# Patient Record
Sex: Female | Born: 1980 | Race: Black or African American | Hispanic: No | Marital: Married | Smoking: Never smoker
Health system: Southern US, Community
[De-identification: ages and names within clinical notes are randomized; demographics above are authoritative.]

## PROBLEM LIST (undated history)

## (undated) DIAGNOSIS — D649 Anemia, unspecified: Secondary | ICD-10-CM

## (undated) DIAGNOSIS — D219 Benign neoplasm of connective and other soft tissue, unspecified: Secondary | ICD-10-CM

## (undated) DIAGNOSIS — E282 Polycystic ovarian syndrome: Secondary | ICD-10-CM

## (undated) HISTORY — DX: Anemia, unspecified: D64.9

## (undated) HISTORY — DX: Benign neoplasm of connective and other soft tissue, unspecified: D21.9

## (undated) HISTORY — DX: Polycystic ovarian syndrome: E28.2

---
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
## 2004-02-22 ENCOUNTER — Emergency Department (HOSPITAL_COMMUNITY): Admission: EM | Admit: 2004-02-22 | Discharge: 2004-02-22 | Payer: Self-pay | Admitting: Emergency Medicine

## 2004-06-12 ENCOUNTER — Emergency Department (HOSPITAL_COMMUNITY): Admission: EM | Admit: 2004-06-12 | Discharge: 2004-06-12 | Payer: Self-pay | Admitting: Emergency Medicine

## 2004-06-18 ENCOUNTER — Ambulatory Visit (HOSPITAL_COMMUNITY): Admission: RE | Admit: 2004-06-18 | Discharge: 2004-06-18 | Payer: Self-pay | Admitting: *Deleted

## 2004-11-04 ENCOUNTER — Ambulatory Visit (HOSPITAL_COMMUNITY): Admission: RE | Admit: 2004-11-04 | Discharge: 2004-11-04 | Payer: Self-pay | Admitting: *Deleted

## 2004-11-08 ENCOUNTER — Inpatient Hospital Stay (HOSPITAL_COMMUNITY): Admission: RE | Admit: 2004-11-08 | Discharge: 2004-11-11 | Payer: Self-pay | Admitting: *Deleted

## 2010-07-11 NOTE — Op Note (Signed)
NAME:  Gina Marquez, Gina Marquez              ACCOUNT NO.:  [id number]   MEDICAL RECORD NO.:  [id number]          PATIENT TYPE:  INP   LOCATION:  A401                          FACILITY:  [facility]   PHYSICIAN:  Donald Carlson, MD     DATE OF BIRTH:  05/24/1980   DATE OF PROCEDURE:  11/08/2004  DATE OF DISCHARGE:                                 OPERATIVE REPORT   DIAGNOSES:  1.  39 week intrauterine pregnancy.  2.  Oligohydramnios.   PROCEDURE:  1.  Placement of continuous lumbar epidural analgesia.  2.  Outlet vacuum extraction 6 pounds 10 ounces female infant.  3.  Midline episiotomy repair, delivery performed by Dr. Donald Carlson.   COMPLICATIONS:  None.   SPECIMENS:  Arterial cord gas and cord blood to laboratory. Placenta is  examined noted be apparently intact with a three-vessel umbilical cord.  Total estimated blood loss less than 500 cc.   SUMMARY:  The patient noted to be at 38-[redacted] weeks gestation. She presented to  [facility] in early stages of active labor she was admitted and  amniotomy performed at 3 cm dilatation. The patient initially request only  IV narcotics however, the second dose of IV sedation was not nearly as  efficacious as the first thus.  The patient requested epidural.  Epidural  was placed without difficulty, functioned very well. She was noted be 6 cm  dilated immediately after placement of the epidural.  Subsequently she  progressed well along the labor curve to 9+ centimeters dilatation. She did  have some variable type decelerations at that point time. These resolved  without any intervention.  Subsequently she did reach complete dilatation,  pushed for about 35 minutes. She was able to have descent of the vertex to  the perineal floor but no further.  In dorsolithotomy position, she is  prepped and draped usual sterile manner. Total of 30 cc 1% lidocaine plain  injected the midline and perineal body with distension of the perineum.  Small midline  episiotomy performed and outlet vacuum extraction is performed  utilizing Kiwi vacuum extractor over the midline episiotomy without  extension. Mouth and nares bulb suctioned of clear amniotic fluid.  Spontaneous rotation occurred to a right anterior shoulder position.  Expulsive efforts plus gentle downward traction resulted in delivery of this  anterior shoulder as well as remainder of the infant without difficulty.  Umbilical cord is then milked toward the infant.  Cord is doubly clamped and  cut.  The infant was given to nursery staff for assessment. Arterial cord  gas and cord blood was obtained. Gentle traction umbilical cord results in  separation which upon examination appears to be intact placenta with three-  vessel umbilical cord. Examination genital tract reveals no lacerations.  Midline episiotomy is not extended. This was repaired utilizing 0 chromic in  a running locked fashion on the vaginal mucosa followed by a deep layer of 2-  0 Vicryl in the superficial transverse perineal  muscle followed by zero chromic running suture on the perineal body. The  patient tolerated delivery very well. She is taken out   dorsolithotomy  position, rolled to her side at which time the epidural catheter was  removed. The blue tip noted be intact.      Donald Carlson, MD  Electronically Signed     DC/MEDQ  D:  11/08/2004  T:  11/10/2004  Job:  [id number] 

## 2010-07-11 NOTE — Op Note (Signed)
[name], Gina Marquez              ACCOUNT NO.:  [id number]   MEDICAL RECORD NO.:  [id number]          PATIENT TYPE:  INP   LOCATION:  A401                          FACILITY:  [facility]   PHYSICIAN:  Langley Gauss, MD     DATE OF BIRTH:  05/20/1980   DATE OF PROCEDURE:  11/08/2004  DATE OF DISCHARGE:                                 OPERATIVE REPORT   DIAGNOSES:  1.  39 week intrauterine pregnancy.  2.  Oligohydramnios.   PROCEDURE:  1.  Placement of continuous lumbar epidural analgesia.  2.  Outlet vacuum extraction 6 pounds 10 ounces female infant.  3.  Midline episiotomy repair, delivery performed by Dr. Langley Gauss.   COMPLICATIONS:  None.   SPECIMENS:  Arterial cord gas and cord blood to laboratory. Placenta is  examined noted be apparently intact with a three-vessel umbilical cord.  Total estimated blood loss less than 500 cc.   SUMMARY:  The patient noted to be at 38-[redacted] weeks gestation. She presented to  [facility] in early stages of active labor she was admitted and  amniotomy performed at 3 cm dilatation. The patient initially request only  IV narcotics however, the second dose of IV sedation was not nearly as  efficacious as the first thus.  The patient requested epidural.  Epidural  was placed without difficulty, functioned very well. She was noted be 6 cm  dilated immediately after placement of the epidural.  Subsequently she  progressed well along the labor curve to 9+ centimeters dilatation. She did  have some variable type decelerations at that point time. These resolved  without any intervention.  Subsequently she did reach complete dilatation,  pushed for about 35 minutes. She was able to have descent of the vertex to  the perineal floor but no further.  In dorsolithotomy position, she is  prepped and draped usual sterile manner. Total of 30 cc 1% lidocaine plain  injected the midline and perineal body with distension of the perineum.  Small midline  episiotomy performed and outlet vacuum extraction is performed  utilizing Kiwi vacuum extractor over the midline episiotomy without  extension. Mouth and nares bulb suctioned of clear amniotic fluid.  Spontaneous rotation occurred to a right anterior shoulder position.  Expulsive efforts plus gentle downward traction resulted in delivery of this  anterior shoulder as well as remainder of the infant without difficulty.  Umbilical cord is then milked toward the infant.  Cord is doubly clamped and  cut.  The infant was given to nursery staff for assessment. Arterial cord  gas and cord blood was obtained. Gentle traction umbilical cord results in  separation which upon examination appears to be intact placenta with three-  vessel umbilical cord. Examination genital tract reveals no lacerations.  Midline episiotomy is not extended. This was repaired utilizing 0 chromic in  a running locked fashion on the vaginal mucosa followed by a deep layer of 2-  0 Vicryl in the superficial transverse perineal  muscle followed by zero chromic running suture on the perineal body. The  patient tolerated delivery very well. She is taken out  dorsolithotomy  position, rolled to her side at which time the epidural catheter was  removed. The blue tip noted be intact.      Langley Gauss, MD  Electronically Signed     DC/MEDQ  D:  11/08/2004  T:  11/10/2004  Job:  [id number]

## 2010-07-11 NOTE — H&P (Signed)
Gina Marquez, Gina Marquez              ACCOUNT NO.:  [id number]   MEDICAL RECORD NO.:  [id number]          PATIENT TYPE:  INP   LOCATION:  LDR1                          FACILITY:  [facility]   PHYSICIAN:  Langley Gauss, MD     DATE OF BIRTH:  1980/12/10   DATE OF ADMISSION:  11/08/2004  DATE OF DISCHARGE:  LH                                HISTORY & PHYSICAL   HISTORY OF PRESENT ILLNESS:  The patient presents and is admitted in early  stages of labor.  The patient is a 30 year old gravida 2, para 0, at 38-5/[redacted]  weeks gestation who complains of onset of uterine contractions at 0500,  increase in frequency and intensity, such that she presented to [facility] for evaluation at which time she was noted to be contracting about  every five minutes with cervical examination 1 to 2 cm dilated, thick  cervix, and posterior position.  The patient admitted in labor at that time.  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  Additionally, a recent ultrasound performed on November 04, 2004, revealed  amniotic fluid index of 2, with adequate growth.  Interpretation of the  radiology was significant for oligohydramnios with a decreased AFI of 2 cm  of uncertain etiology.  This report was obtained in the office on November 06, 2004, at which time the patient was contacted, and advised to present  for evaluation.  The patient does state today that she was contacted on  November 06, 2004, and advised to come in for evaluation, but she did not  comply with this.  The remainder of prenatal course also complicated by  positive sickle cell trait.  The father of the baby's status was unknown,  but the patient provides no information regarding this.  We did have him  tested during the pregnancy and he was noted to be GBS carrier status  negative.   ALLERGIES:  HYDROCODONE.  States that all  codeine's give her hives.   PAST MEDICAL HISTORY:  No other medical or surgical history.   SOCIAL HISTORY:  The patient was employed at [employer] _____________.  The  father of the baby is Tinlee Navarrette, and was employed at [employer].  They are  currently separated, or at least at the time of questioning.   PHYSICAL EXAMINATION:  VITAL SIGNS:  Height is 5 feet 8 inches.  Pre-  pregnancy is 190, most recent weight is 216.  Blood pressure 130/82, pulse  rate 80, respiratory rate is 20.  HEENT:  Negative.  No adenopathy.  NECK:  Supple, thyroid is nonpalpable.  LUNGS:  Clear.  CARDIOVASCULAR:  Regular rate and rhythm.  ABDOMEN:  Soft and nontender.  Gravid uterus is identified.  Fundal height  on November 04, 2004, is 36 cm, vertex presentation identified.  EXTREMITIES:  Only trace edema.  PELVIC:  Normal external genitalia, no lesions or ulcerations identified.  No vaginal bleeding.  No leaks or fluid.  External fetal monitoring reveals  a reassuring fetal heart  rate with accelerations noted, contractions every  five minutes.  Pelvic examination:  Cervix is 3 cm dilated, mid position,  80% effaced, vertex at a 0 station and well applied to the cervix.  Fetal  scalp electrode is placed with resultant amniotomy, no fluid has been seen  at this time.   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  The patient  will require treatment during the course of labor in an effort to prevent  vertical transmission to the infant.  She upon examination does seem to have  a clinical adequate pelvis by clinical _______________.      Langley Gauss, MD  Electronically Signed     DC/MEDQ  D:  11/08/2004  T:  11/08/2004  Job:  [id number]

## 2010-07-11 NOTE — Op Note (Signed)
Gina Marquez, Gina Marquez              ACCOUNT NO.:  [id number]   MEDICAL RECORD NO.:  [id number]           PATIENT TYPE:   LOCATION:                                FACILITY:  [facility]   PHYSICIAN:  Langley Gauss, MD          DATE OF BIRTH:   DATE OF PROCEDURE:  11/08/2004  DATE OF DISCHARGE:                                 OPERATIVE REPORT   PROCEDURE:  Placement of continuous lumbar epidural analgesia.  Appropriate  informed consent is obtained.  The patient is in the seated position.  Bony  landmarks identified and L3-L4 interspace is chosen.  The patient's back is  sterilely prepped and draped utilizing epidural tray, 5 cc 1% lidocaine  plain injected to raise a small skin wheal.  A 17 gauge Tuohy Schliff needle  was then utilized for a loss of resistance with air-filled glass syringe.  Prior to identify atraumatic entry in the epidural space on the first  attempt excellent loss resistance was noted.  Initial test dose 5 cc 1.5%  lidocaine plus epinephrine injected without complication through the  epidural needle.  The epidural catheter was then inserted to a depth of 5  cm.  The epidural needle was removed, however, aspiration tests reveals  frank blood.  The epidural catheter was then sequentially pulled back to 4,  3, 2 and 1 cm depth within the epidural space.  On each of these frank blood  was obtained, thus the epidural catheter was removed.  Reinsertion was  performed.  On the second attempt, there was again  some loss resistance  consistent with entering the epidural space.  The catheter was now inserted  a depth of 5 cm.  Epidural needle was removed. Aspiration test was negative.  A second test dose 2 cc 1.5% lidocaine plus epinephrine was injected through  the epidural needle. Again no signs of CSF or intravascular injection  obtained thus the epidural catheter was secured in place.  The patient the  patient is connected the infusion pump containing the standard continuous  mixture and is treated with a continuous infusion rate of 14 cc/hour upon  return to lateral supine position.  The patient does have evidence of  bilateral block in place with warmth and tingling in her feet.  Examination  reveals cervix 6 cm completely effaced, vertex at a 0 station, well applied  to the cervix. Fetal scalp electrode reveals a reassuring fetal heart rate  with the uterine contractions occurring every 3-4 minutes.   ASSESSMENT AND PLAN:  The patient was adequate bilateral block in place  connected to the infusion pump.  She is receiving Pitocin augmentation for  dysfunctional labor pattern with inadequate frequency and intensive uterine  contractions.      Langley Gauss, MD  Electronically Signed     DC/MEDQ  D:  11/08/2004  T:  11/08/2004  Job:  [id number]

## 2010-07-11 NOTE — Discharge Summary (Signed)
Gina Marquez, Gina Marquez              ACCOUNT NO.:  [id number]   MEDICAL RECORD NO.:  [id number]          PATIENT TYPE:  INP   LOCATION:  A401                          FACILITY:  [facility]   PHYSICIAN:  Langley Gauss, MD     DATE OF BIRTH:  12/09/1980   DATE OF ADMISSION:  11/08/2004  DATE OF DISCHARGE:  09/19/2006LH                                 DISCHARGE SUMMARY   Placement of Epidural:  November 08, 2004.  Vaginal Delivery:  November 08, 2004.   LABORATORY DATA:  Sickle trait positive.  Father of the baby is negative.  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  Admission labs:  Hemoglobin and hematocrit  12.1/35.7 with white count 21.0, postpartum day #1 11.7/32.8 with white  count of 16.1.  Postpartum day #2, hemoglobin 13.1, hematocrit 38.3.   DISCHARGE MEDICATIONS:  Tylox and Motrin for pain relief.  The patient is  bottle feeding.  She will start Ortho Tri-Cyclen for birth control purposes  in about 4 weeks postpartum.  Circumcision was performed on November 11, 2004, on day of discharge.   HOSPITAL COURSE:  See previous dictation.  Postpartum, the patient did well.  She had no postpartum complications, bonded well with the infant.  Infant  did require hospitalization x48 hours postpartum due to the GBS positive  carrier status.  Mother and infant subsequently discharged home on November 11, 2004.      Langley Gauss, MD  Electronically Signed     DC/MEDQ  D:  11/26/2004  T:  11/26/2004  Job:  [id number]

## 2012-02-02 ENCOUNTER — Emergency Department (HOSPITAL_COMMUNITY): Payer: BC Managed Care – PPO

## 2012-02-02 ENCOUNTER — Emergency Department (HOSPITAL_COMMUNITY)
Admission: EM | Admit: 2012-02-02 | Discharge: 2012-02-02 | Disposition: A | Discharge status: Home / Self Care | Payer: BC Managed Care – PPO | Attending: Emergency Medicine | Admitting: Emergency Medicine

## 2012-02-02 ENCOUNTER — Encounter (HOSPITAL_COMMUNITY): Payer: Self-pay

## 2012-02-02 DIAGNOSIS — M5380 Other specified dorsopathies, site unspecified: Secondary | ICD-10-CM | POA: Insufficient documentation

## 2012-02-02 DIAGNOSIS — M542 Cervicalgia: Secondary | ICD-10-CM | POA: Insufficient documentation

## 2012-02-02 DIAGNOSIS — M62838 Other muscle spasm: Secondary | ICD-10-CM

## 2012-02-02 MED ORDER — IBUPROFEN 800 MG PO TABS
800.0000 mg | ORAL_TABLET | Freq: Once | ORAL | Status: AC
  Administered 2012-02-02: 800 mg via ORAL
  Filled 2012-02-02: qty 1

## 2012-02-02 MED ORDER — METHOCARBAMOL 500 MG PO TABS: 1000.0000 mg | ORAL_TABLET | Freq: Two times a day (BID) | ORAL | Status: DC

## 2012-02-02 MED ORDER — DIAZEPAM 5 MG PO TABS
10.0000 mg | ORAL_TABLET | Freq: Once | ORAL | Status: AC
  Administered 2012-02-02: 10 mg via ORAL
  Filled 2012-02-02: qty 2

## 2012-02-02 NOTE — ED Provider Notes (Signed)
History    This chart was scribed for Jones Skene, MD, MD by Smitty Pluck, ED Scribe. The patient was seen in room APA07 and the patient's care was started at 7:52AM.   CSN: [id number]  Arrival date & time 02/02/12  4098      Chief Complaint  Patient presents with  . Back Pain    The history is provided by the patient. No language interpreter was used.   Gina Marquez is a 31 y.o. female who presents to the Emergency Department complaining of waxing and waning, moderate sharp neck pain onset 8 days ago. Pain starts at C7 and shoots up the spine. Pain is worse on right side compared to left. Pain is rated at 7/10 currently but at worse is 9/10. Per pt pain is worse at night when she is sleeping in bed. She reports that she has a new mattress. She has used new pillows that have mildly improved symptoms. Pt went to PCP last week and was told the pain was caused by muscle spasm. She has taken prescribed medication without relief. She denies injury, numbness or tingling in extremities, weakness in extremities, dropping objects, chest pain, SOB, rash, nausea, vomiting, abdominal pain and any other pain.   History reviewed. No pertinent past medical history.  History reviewed. No pertinent past surgical history.  No family history on file.  History  Substance Use Topics  . Smoking status: Never Smoker   . Smokeless tobacco: Not on file  . Alcohol Use: No    OB History    Grav Para Term Preterm Abortions TAB SAB Ect Mult Living                  Review of Systems At least 10pt or greater review of systems completed and are negative except where specified in the HPI.  Allergies  Review of patient's allergies indicates no known allergies.  Home Medications  No current outpatient prescriptions on file.  BP 109/77  Pulse 75  Temp 98.3 F (36.8 C) (Oral)  Resp 20  Ht 5\' 6"  (1.676 m)  Wt 200 lb (90.719 kg)  BMI 32.28 kg/m2  SpO2 100%  LMP 12/30/2011  Physical Exam   Nursing note and vitals reviewed.  Nursing notes reviewed.  Electronic medical record reviewed. VITAL SIGNS:   Filed Vitals:   02/02/12 0735  BP: 109/77  Pulse: 75  Temp: 98.3 F (36.8 C)  TempSrc: Oral  Resp: 20  Height: 5\' 6"  (1.676 m)  Weight: 200 lb (90.719 kg)  SpO2: 100%   CONSTITUTIONAL: Awake, oriented, appears non-toxic HENT: Atraumatic, normocephalic, oral mucosa pink and moist, airway patent. Nares patent without drainage. External ears normal. EYES: Conjunctiva clear, EOMI, PERRLA NECK: Trachea midline, non-tender in the midline. Patient has paraspinous muscle tenderness straight about C4 that radiates upwards towards the base of the skull, supple CARDIOVASCULAR: Normal heart rate, Normal rhythm, No murmurs, rubs, gallops PULMONARY/CHEST: Clear to auscultation, no rhonchi, wheezes, or rales. Symmetrical breath sounds. Non-tender. ABDOMINAL: Non-distended, soft, non-tender - no rebound or guarding.  BS normal. NEUROLOGIC: Non-focal, moving all four extremities, no gross sensory or motor deficits. Facial sensation equal to light touch bilaterally.  Good muscle bulk in the masseter muscle and good lateral movement of the jaw.  Facial expressions equal and good strength with smile/frown and puffed cheeks.  Hearing grossly intact to finger rub test.  Uvula, tongue are midline with no deviation. Symmetrical palate elevation.  Trapezius and SCM muscles are 5/5 strength bilaterally.  EXTREMITIES: No clubbing, cyanosis, or edema SKIN: Warm, Dry, No erythema, No rash  ED Course  Procedures (including critical care time) DIAGNOSTIC STUDIES: Oxygen Saturation is 100% on room air, normal by my interpretation.    COORDINATION OF CARE: 8:00 AM Discussed ED treatment with pt  8:42 AM Recheck: Discussed pt imaging results with pt. Pt will be given Valium for pain before discharge. Pt is ready for discharge.    Labs Reviewed - No data to display Dg Cervical Spine 2-3  Views  02/02/2012  *RADIOLOGY REPORT*  Clinical Data: back pain  CERVICAL SPINE - 2-3 VIEW  Comparison: None  Findings: There is reversal of normal cervical lordosis.  The vertebral body heights are well preserved.  The spaces are within normal limits.  No prevertebral soft tissue swelling.  IMPRESSION:  1.  No fractures or subluxations. 2.  Reversal of normal cervical lordosis which may reflect patient positioning or muscle spasm.   Original Report Authenticated By: Signa Kell, M.D.      1. Neck pain   2. Cervical paraspinal muscle spasm       MDM  Gina Marquez is a 31 y.o. female presents with neck pain since last week. Primary care physician give her some pain medicine and muscle relaxants which are not helping. On further history, patient says that she has new bedding and new pillows. I told her to go back to her prior sleeping arrangement and I prescribed Robaxin for discomfort. Patient is urged to followup with her primary care physician.  No neurologic deficits, no neurosurgical emergency at this time.  I explained the diagnosis and have given explicit precautions to return to the ER including numbness tingling or weakness in her upper extremities, worsening neck pain or any other new or worsening symptoms. The patient understands and accepts the medical plan as it's been dictated and I have answered their questions. Discharge instructions concerning home care and prescriptions have been given.  The patient is STABLE and is discharged to home in good condition.   I personally performed the services described in this documentation, which was scribed in my presence. The recorded information has been reviewed and is accurate. Jones Skene, M.D.     Jones Skene, MD 02/02/12 1547

## 2012-02-02 NOTE — ED Notes (Signed)
Pt reports back pain and spasm since last week, was seen by pmd and given meds, they are not helping. Denies any known injury or urinary s/s

## 2012-06-14 ENCOUNTER — Other Ambulatory Visit: Payer: Self-pay | Admitting: Family Medicine

## 2012-06-14 NOTE — Telephone Encounter (Signed)
Ok times 3 total 

## 2012-06-14 NOTE — Telephone Encounter (Signed)
Med called into [facility].

## 2012-10-29 ENCOUNTER — Encounter: Payer: Self-pay | Admitting: *Deleted

## 2012-10-31 ENCOUNTER — Encounter: Payer: Self-pay | Admitting: Family Medicine

## 2012-10-31 ENCOUNTER — Ambulatory Visit (INDEPENDENT_AMBULATORY_CARE_PROVIDER_SITE_OTHER): Payer: BC Managed Care – PPO | Admitting: Family Medicine

## 2012-10-31 VITALS — BP 132/90 | Ht 65.0 in | Wt 213.8 lb

## 2012-10-31 DIAGNOSIS — Z0000 Encounter for general adult medical examination without abnormal findings: Secondary | ICD-10-CM

## 2012-10-31 DIAGNOSIS — R635 Abnormal weight gain: Secondary | ICD-10-CM

## 2012-10-31 DIAGNOSIS — Z79899 Other long term (current) drug therapy: Secondary | ICD-10-CM

## 2012-10-31 DIAGNOSIS — R358 Other polyuria: Secondary | ICD-10-CM

## 2012-10-31 NOTE — Progress Notes (Signed)
  Subjective:    Patient ID: Gina Marquez, female    DOB: September 17, 1980, 31 y.o.   MRN: [id number]  HPI Here today for annual exam.  Concerned about weight. She exercises twice a week she does try to drink mainly water and eat healthy.  Would like a refill on Xanax medication. Patient uses sparingly she would like a refill. The patient comes in today for a wellness visit.  A review of their health history was completed.  A review of medications was also completed. Any necessary refills were discussed. Sensible healthy diet was discussed. Importance of minimizing excessive salt and carbohydrates was also discussed. Safety was stressed including driving, activities at work and at home where applicable. Importance of regular physical activity for overall health was discussed. Preventative measures appropriate for age were discussed. Time was spent with the patient discussing any concerns they have about their well-being.  She denies any rectal bleeding vaginal bleeding. She knows she ought to followup for a Pap smear in the near future. She needs his physical mainly for her job at [employer].  Review of Systems  Constitutional: Negative for activity change, appetite change and fatigue.  HENT: Negative for congestion, rhinorrhea, neck pain and ear discharge.   Eyes: Negative for discharge.  Respiratory: Negative for cough, chest tightness and wheezing.   Cardiovascular: Negative for chest pain.  Gastrointestinal: Negative for vomiting and abdominal pain.  Genitourinary: Negative for frequency and difficulty urinating.  Allergic/Immunologic: Negative for environmental allergies and food allergies.  Neurological: Negative for weakness and headaches.  Psychiatric/Behavioral: Negative for behavioral problems and agitation.       Objective:   Physical Exam  Constitutional: She is oriented to person, place, and time. She appears well-developed and well-nourished.  HENT:  Head:  Normocephalic.  Right Ear: External ear normal.  Left Ear: External ear normal.  Eyes: Pupils are equal, round, and reactive to light.  Neck: Normal range of motion. No thyromegaly present.  Cardiovascular: Normal rate, regular rhythm, normal heart sounds and intact distal pulses.   No murmur heard. Pulmonary/Chest: Effort normal and breath sounds normal. No respiratory distress. She has no wheezes.  Abdominal: Soft. Bowel sounds are normal. She exhibits no distension and no mass. There is no tenderness.  Musculoskeletal: Normal range of motion. She exhibits no edema and no tenderness.  Lymphadenopathy:    She has no cervical adenopathy.  Neurological: She is alert and oriented to person, place, and time. She exhibits normal muscle tone.  Skin: Skin is warm and dry.  Psychiatric: She has a normal mood and affect. Her behavior is normal.          Assessment & Plan:  Wellness exam-she is fit for her job. It was encouraged for her to watch calories closely and exercise on a regular basis and try to bring her weight down. She needs exercise at least 5 times a week. She be given today. Followup to recheck in 2 days. Warning signs discussed. Refills regarding Xanax given. Safety stressed.  I talked with patient at length about how around her neck she has some darkening of the skin I recommend checking for cholesterol issues as well his diabetes. She is concerned about some body changes including increased growth that the back of her neck we will check cortisol level await results

## 2012-11-02 LAB — TB SKIN TEST
Induration: 0 mm
TB Skin Test: NEGATIVE

## 2013-01-23 LAB — BASIC METABOLIC PANEL
BUN: 10 mg/dL (ref 6–23)
CO2: 30 mEq/L (ref 19–32)
Calcium: 9.3 mg/dL (ref 8.4–10.5)
Chloride: 100 mEq/L (ref 96–112)
Creat: 0.73 mg/dL (ref 0.50–1.10)
Glucose, Bld: 86 mg/dL (ref 70–99)
Potassium: 3.8 mEq/L (ref 3.5–5.3)

## 2013-01-23 LAB — LIPID PANEL: HDL: 50 mg/dL (ref >39)

## 2013-01-23 LAB — HEMOGLOBIN A1C: Hgb A1c MFr Bld: 5.7 % — ABNORMAL HIGH (ref <5.7)

## 2013-01-24 LAB — CORTISOL: Cortisol, Plasma: 8.8 ug/dL

## 2013-01-30 NOTE — Progress Notes (Signed)
Notified patient about results. I also sent her info regarding diabetic classes/diets. I told her to call us back in the Spring for repeat BW. She verbalized understanding.

## 2013-03-03 ENCOUNTER — Emergency Department (HOSPITAL_COMMUNITY)
Admission: EM | Admit: 2013-03-03 | Discharge: 2013-03-03 | Disposition: A | Discharge status: Home / Self Care | Payer: BC Managed Care – PPO | Attending: Emergency Medicine | Admitting: Emergency Medicine

## 2013-03-03 ENCOUNTER — Encounter (HOSPITAL_COMMUNITY): Payer: Self-pay | Admitting: Emergency Medicine

## 2013-03-03 DIAGNOSIS — Y929 Unspecified place or not applicable: Secondary | ICD-10-CM | POA: Insufficient documentation

## 2013-03-03 DIAGNOSIS — S161XXA Strain of muscle, fascia and tendon at neck level, initial encounter: Secondary | ICD-10-CM

## 2013-03-03 DIAGNOSIS — S139XXA Sprain of joints and ligaments of unspecified parts of neck, initial encounter: Secondary | ICD-10-CM | POA: Insufficient documentation

## 2013-03-03 DIAGNOSIS — Y939 Activity, unspecified: Secondary | ICD-10-CM | POA: Insufficient documentation

## 2013-03-03 DIAGNOSIS — X58XXXA Exposure to other specified factors, initial encounter: Secondary | ICD-10-CM | POA: Insufficient documentation

## 2013-03-03 MED ORDER — METHOCARBAMOL 500 MG PO TABS: 500.0000 mg | ORAL_TABLET | Freq: Two times a day (BID) | ORAL | Status: DC

## 2013-03-03 MED ORDER — NAPROXEN 375 MG PO TABS: 375.0000 mg | ORAL_TABLET | Freq: Two times a day (BID) | ORAL | Status: DC

## 2013-03-03 NOTE — ED Notes (Signed)
Post neck pain, Increased pain with movement  No injury

## 2013-03-03 NOTE — ED Provider Notes (Signed)
CSN: [id number]     Arrival date & time 03/03/13  1715 History   First MD Initiated Contact with Patient 03/03/13 1730     Chief Complaint  Patient presents with  . Neck Pain   (Consider location/radiation/quality/duration/timing/severity/associated sxs/prior Treatment) HPI  She presents to the emergency department with complaints of right-sided neck pain since new years eve She feels like she may have slept on it wrong. She remembers this happening one year ago and she was given a muscle relaxer and some anti-inflammatories which helped significantly. She reports being out of his medications. She denies any injuries or falls. She denies having headaches, nausea, vomiting, chills, weakness or pain in her extremities. She otherwise has felt well. 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  History reviewed. No pertinent past medical history. History reviewed. No pertinent past surgical history. Family History  Problem Relation Age of Onset  . Hypertension Mother   . Diabetes Mother   . Heart attack Mother    History  Substance Use Topics  . Smoking status: Never Smoker   . Smokeless tobacco: Not on file  . Alcohol Use: No   OB History   Grav Para Term Preterm Abortions TAB SAB Ect Mult Living                 Review of Systems The patient denies anorexia, fever, weight loss,, vision loss, decreased hearing, hoarseness, chest pain, syncope, dyspnea on exertion, peripheral edema, balance deficits, hemoptysis, abdominal pain, melena, hematochezia, severe indigestion/heartburn, hematuria, incontinence, genital sores, muscle weakness, suspicious skin lesions, transient blindness, difficulty walking, depression, unusual weight change, abnormal bleeding, enlarged lymph nodes, angioedema, and breast masses.  Allergies  Codeine  Home Medications   Current Outpatient Rx  Name  Route  Sig  Dispense  Refill  . ALPRAZolam (XANAX) 0.5 MG tablet   Oral   Take 0.5 mg by mouth at  bedtime as needed for sleep.         . methocarbamol (ROBAXIN) 500 MG tablet   Oral   Take 1 tablet (500 mg total) by mouth 2 (two) times daily.   20 tablet   0   . naproxen (NAPROSYN) 375 MG tablet   Oral   Take 1 tablet (375 mg total) by mouth 2 (two) times daily.   20 tablet   0    BP 110/72  Pulse 80  Temp(Src) 98.4 F (36.9 C) (Oral)  Resp 18  Ht 5\' 4"  (1.626 m)  Wt 208 lb (94.348 kg)  BMI 35.69 kg/m2  SpO2 100%  LMP 02/11/2013 Physical Exam  Nursing note and vitals reviewed. Constitutional: She appears well-developed and well-nourished. No distress.  HENT:  Head: Normocephalic and atraumatic.  Eyes: Pupils are equal, round, and reactive to light.  Neck: Trachea normal. Neck supple. Muscular tenderness present. No spinous process tenderness present. Decreased range of motion (due to pain) present.    Cardiovascular: Normal rate and regular rhythm.   Pulmonary/Chest: Effort normal.  Abdominal: Soft.  Neurological: She is alert.  Skin: Skin is warm and dry.    ED Course  Procedures (including critical care time) Labs Review Labs Reviewed - No data to display Imaging Review No results found.  EKG Interpretation   None       MDM   1. Cervical muscle strain, initial encounter    Patient without any systemic symptoms to suggest spinal cord injury or meningitis. Consistent with muscular strain.  Rx ; Naprosyn and robaxin,.  To alternate ice and heat.  32 y.o.Gina Marquez's evaluation in the Emergency Department is complete. It has been determined that no acute conditions requiring further emergency intervention are present at this time. The patient/guardian have been advised of the diagnosis and plan. We have discussed signs and symptoms that warrant return to the ED, such as changes or worsening in symptoms.  Vital signs are stable at discharge. Filed Vitals:   03/03/13 1730  BP: 110/72  Pulse: 80  Temp: 98.4 F (36.9 C)  Resp: 18     Patient/guardian has voiced understanding and agreed to follow-up with the PCP or specialist.    Dorthula Matas, PA-C 03/03/13 1745

## 2013-03-03 NOTE — ED Notes (Signed)
Pt seen and evaluated by EDPa for intial assessment. 

## 2013-03-03 NOTE — Discharge Instructions (Signed)

## 2013-03-04 NOTE — ED Provider Notes (Signed)
Medical screening examination/treatment/procedure(s) were performed by non-physician practitioner and as supervising physician I was immediately available for consultation/collaboration.  Valta Dillon T Walburga Hudman, MD 03/04/13 1530 

## 2013-03-06 ENCOUNTER — Encounter: Payer: Self-pay | Admitting: Nurse Practitioner

## 2013-03-06 ENCOUNTER — Ambulatory Visit (INDEPENDENT_AMBULATORY_CARE_PROVIDER_SITE_OTHER): Payer: BC Managed Care – PPO | Admitting: Nurse Practitioner

## 2013-03-06 ENCOUNTER — Encounter: Payer: Self-pay | Admitting: Family Medicine

## 2013-03-06 VITALS — BP 122/76 | Ht 65.0 in | Wt 211.0 lb

## 2013-03-06 DIAGNOSIS — M62838 Other muscle spasm: Secondary | ICD-10-CM

## 2013-03-06 MED ORDER — DIAZEPAM 10 MG PO TABS: ORAL_TABLET | ORAL | Status: DC

## 2013-03-06 NOTE — Patient Instructions (Signed)
Icy Hot Smart Relief TENS unit Massage therapy  

## 2013-03-10 ENCOUNTER — Encounter: Payer: Self-pay | Admitting: Nurse Practitioner

## 2013-03-10 NOTE — Progress Notes (Signed)
Subjective:  Presents for complaints of a flareup of her neck pain/muscle spasms. Has had problems with this in the past. Began on 1/1 after sleeping on a couch. Went to ER on 1/9 diagnosed with muscle spasms. No relief with naproxen or Robaxin. Pain goes across his shoulders and neck area more so on the right side. No numbness or weakness of the arms or legs.  Objective:   BP 122/76  Ht 5\' 5"  (1.651 m)  Wt 211 lb (95.709 kg)  BMI 35.11 kg/m2  LMP 02/11/2013 NAD. Alert, oriented. Lungs clear. Heart regular rate rhythm. Very tight tender muscles noted along the cervical/lateral neck area into the trapezius and upper back. Good ROM of the neck noted with mild tenderness. Hand and arm strength 5+ bilateral upper extremities. Sensation grossly intact. X-ray cervical spine 02/02/2012 shows probable muscle spasms otherwise normal.  Assessment:Muscle spasms of head and/or neck  Plan: Meds ordered this encounter  Medications  . diazepam (VALIUM) 10 MG tablet    Sig: One po qhs prn muscle spasms    Dispense:  20 tablet    Refill:  0    Order Specific Question:  Supervising Provider    Answer:  Merlyn AlbertLUKING, WILLIAM S [[id number]]   switch to diazepam at bedtime as directed. This has helped with severe muscle spasms in the past. Drowsiness precautions. Ice or heat applications. OTC TENS unit as directed. Massage therapy. Expect gradual resolution of symptoms. Callback in 7-10 days if no improvement, sooner if worse.

## 2013-06-07 ENCOUNTER — Encounter: Payer: Self-pay | Admitting: Nurse Practitioner

## 2013-06-07 ENCOUNTER — Ambulatory Visit (INDEPENDENT_AMBULATORY_CARE_PROVIDER_SITE_OTHER): Payer: BC Managed Care – PPO | Admitting: Nurse Practitioner

## 2013-06-07 VITALS — BP 122/82 | Ht 65.0 in | Wt 218.0 lb

## 2013-06-07 DIAGNOSIS — L83 Acanthosis nigricans: Secondary | ICD-10-CM

## 2013-06-07 DIAGNOSIS — E282 Polycystic ovarian syndrome: Secondary | ICD-10-CM

## 2013-06-07 NOTE — Patient Instructions (Signed)
Metabolic syndrome [email or web address]

## 2013-06-08 ENCOUNTER — Encounter: Payer: Self-pay | Admitting: Nurse Practitioner

## 2013-06-08 DIAGNOSIS — L83 Acanthosis nigricans: Secondary | ICD-10-CM | POA: Insufficient documentation

## 2013-06-08 DIAGNOSIS — E282 Polycystic ovarian syndrome: Secondary | ICD-10-CM | POA: Insufficient documentation

## 2013-06-08 LAB — TESTOSTERONE: Testosterone: 45 ng/dL (ref 10–70)

## 2013-06-08 NOTE — Progress Notes (Signed)
Subjective:  Initially presented for Pap smear. Since she had a physical in September. Patient had a normal Pap smear here in 2013. Married, no new sexual partners. Has been off her birth control for the past 2 months. Slight increase in cramping. Has had 2 regular cycles with normal flow. Had some difficulty conceiving her first child, her weight then was about 180 pounds. Has had increased acne with weight gain. No excessive hair growth. Difficulty losing weight. No regular activity.  Objective:   BP 122/82  Ht 5\' 5"  (1.651 m)  Wt 218 lb (98.884 kg)  BMI 36.28 kg/m2  LMP 06/02/2013 NAD. Alert, oriented. Lungs clear. Heart regular. Mild facial acne noted. Moderate acanthosis nigricans noted on the posterior neck and axillary area. Hemoglobin A1c on 01/23/2013 was 5.7.  Assessment:PCOS (polycystic ovarian syndrome) - Plan: Testosterone, Insulin, fasting  Morbid obesity - Plan: Testosterone, Insulin, fasting  Acanthosis nigricans - Plan: Testosterone, Insulin, fasting  Plan: Patient had other lab work done in December 2014. Obtain a fasting insulin level and a testosterone level. Discussed importance of weight loss with a goal of 21 pounds or 10% of her body weight. Also recommend regular activity. Healthy diet. Discussed programs available to help her with weight loss. If patient's insulin levels are elevated, recommend starting metformin.

## 2013-06-09 ENCOUNTER — Encounter: Payer: Self-pay | Admitting: Nurse Practitioner

## 2013-06-09 DIAGNOSIS — E161 Other hypoglycemia: Secondary | ICD-10-CM | POA: Insufficient documentation

## 2013-06-09 LAB — INSULIN, FASTING: Insulin fasting, serum: 31 u[IU]/mL — ABNORMAL HIGH (ref 3–28)

## 2013-06-14 ENCOUNTER — Other Ambulatory Visit: Payer: Self-pay | Admitting: *Deleted

## 2013-06-14 MED ORDER — METFORMIN HCL 500 MG PO TABS: 500.0000 mg | ORAL_TABLET | Freq: Two times a day (BID) | ORAL | Status: DC

## 2013-07-27 ENCOUNTER — Telehealth: Payer: Self-pay | Admitting: Family Medicine

## 2013-07-27 NOTE — Telephone Encounter (Signed)
Patient needs Rx for the medicine that Eber Jones prescribed her the last time for her muscle spasms. She said she does not know the name of this medication.    [facility]

## 2013-07-28 NOTE — Telephone Encounter (Signed)
Which one Robaxin or Valium?

## 2013-07-28 NOTE — Telephone Encounter (Signed)
Voice mail box not set up.

## 2013-07-31 ENCOUNTER — Other Ambulatory Visit: Payer: Self-pay | Admitting: Nurse Practitioner

## 2013-07-31 MED ORDER — DIAZEPAM 10 MG PO TABS: ORAL_TABLET | ORAL | Status: DC

## 2013-07-31 NOTE — Telephone Encounter (Signed)
Order sent in; should NOT be taken if pregnant. Thanks.

## 2013-07-31 NOTE — Telephone Encounter (Signed)
[facility] 07/31/13. Valium faxed to [facility].

## 2013-08-23 ENCOUNTER — Telehealth: Payer: Self-pay | Admitting: Family Medicine

## 2013-08-23 NOTE — Telephone Encounter (Signed)
Patient said that the Metformin that she was prescribed by Eber JonesCarolyn is making her sick on her stomach and wants to know if we can try another medication.    [facility]

## 2013-08-24 NOTE — Telephone Encounter (Signed)
Patient notified and verbalized understanding. 

## 2013-08-24 NOTE — Telephone Encounter (Signed)
A few things: take med with food; start with one pill with supper; try to take twice a day WITH meals; if she still has side effects, try taking at least once a day; also try starting with 1/2 pill and slowly increasing dose. This is the only med specifically for her problem as recommended by specialists. If she just cannot take med, still work on diet, exercise and weight loss.

## 2013-10-27 ENCOUNTER — Ambulatory Visit (INDEPENDENT_AMBULATORY_CARE_PROVIDER_SITE_OTHER): Payer: BC Managed Care – PPO | Admitting: Nurse Practitioner

## 2013-10-27 ENCOUNTER — Encounter: Payer: Self-pay | Admitting: Nurse Practitioner

## 2013-10-27 VITALS — BP 128/84 | Ht 65.0 in | Wt 209.0 lb

## 2013-10-27 DIAGNOSIS — M62838 Other muscle spasm: Secondary | ICD-10-CM

## 2013-10-27 DIAGNOSIS — E161 Other hypoglycemia: Secondary | ICD-10-CM

## 2013-10-27 DIAGNOSIS — E282 Polycystic ovarian syndrome: Secondary | ICD-10-CM

## 2013-10-27 LAB — POCT GLYCOSYLATED HEMOGLOBIN (HGB A1C): Hemoglobin A1C: 6.6

## 2013-10-27 MED ORDER — DIAZEPAM 10 MG PO TABS: ORAL_TABLET | ORAL | Status: DC

## 2013-10-27 MED ORDER — PHENTERMINE HCL 37.5 MG PO TABS: 37.5000 mg | ORAL_TABLET | Freq: Every day | ORAL | Status: DC

## 2013-10-30 ENCOUNTER — Encounter: Payer: Self-pay | Admitting: Nurse Practitioner

## 2013-10-30 NOTE — Progress Notes (Signed)
Subjective:  Presents for recheck. Due to stressful family situation, patient's exercise has been limited. Also has difficulty following healthy diet. Has cut out sweet tea and regular sodas. Cycles mostly regular, normal flow. Wants to conceive. Has had a flareup of her neck and shoulder pain, has been sleeping at the hospital since her mother is hospitalized. Takes Valium sparingly only for severe muscle spasms.  Objective:   BP 128/84  Ht  (1.651 m)  Wt 209 lb (94.802 kg)  BMI 34.78 kg/m2  LMP 10/27/2013 NAD. Alert, oriented. Lungs clear. Heart regular rate rhythm. Tight tender muscles noted along the cervical lateral neck and trapezius. More on the right side. Results for orders placed in visit on 10/27/13  POCT GLYCOSYLATED HEMOGLOBIN (HGB A1C)      Result Value Ref Range   Hemoglobin A1C 6.6       Assessment:  Problem List Items Addressed This Visit     Endocrine   PCOS (polycystic ovarian syndrome)   Relevant Orders      POCT glycosylated hemoglobin (Hb A1C) (Completed)   Hyperinsulinemia - Primary   Relevant Orders      POCT glycosylated hemoglobin (Hb A1C) (Completed)     Other   Morbid obesity   Relevant Medications      phentermine (ADIPEX-P) 37.5 MG tablet    Other Visit Diagnoses   Muscle spasms of head or neck           Plan:  Meds ordered this encounter  Medications  . phentermine (ADIPEX-P) 37.5 MG tablet    Sig: Take 1 tablet (37.5 mg total) by mouth daily before breakfast.    Dispense:  30 tablet    Refill:  0    Order Specific Question:  Supervising Provider    Answer:  Merlyn Albert [[id number]]  . diazepam (VALIUM) 10 MG tablet    Sig: One po qhs prn muscle spasms    Dispense:  20 tablet    Refill:  0    Order Specific Question:  Supervising Provider    Answer:  Riccardo Dubin   Encourage regular exercise and healthy diet. Hemoglobin A1c has gone up from 5.7-6.6. Patient understands that neither medicine should be taken if she is  pregnant. Reviewed potential adverse effects of phentermine. DC med and call if any problems. Return in about 1 month (around 11/26/2013).

## 2013-11-27 ENCOUNTER — Ambulatory Visit (INDEPENDENT_AMBULATORY_CARE_PROVIDER_SITE_OTHER): Payer: BC Managed Care – PPO | Admitting: Nurse Practitioner

## 2013-11-27 ENCOUNTER — Encounter: Payer: Self-pay | Admitting: Nurse Practitioner

## 2013-11-27 VITALS — BP 110/76 | Ht 65.0 in | Wt 201.8 lb

## 2013-11-27 DIAGNOSIS — Z23 Encounter for immunization: Secondary | ICD-10-CM

## 2013-11-27 DIAGNOSIS — E161 Other hypoglycemia: Secondary | ICD-10-CM

## 2013-11-27 MED ORDER — PHENTERMINE HCL 37.5 MG PO TABS: 37.5000 mg | ORAL_TABLET | Freq: Every day | ORAL | Status: DC

## 2013-11-27 NOTE — Patient Instructions (Signed)
21 day fix

## 2013-12-01 ENCOUNTER — Encounter: Payer: Self-pay | Admitting: Nurse Practitioner

## 2013-12-01 NOTE — Progress Notes (Signed)
Subjective:  Presents for recheck on her weight. Getting regular exercise. Doing much better with her diet. Denies any adverse effects from phentermine.  Objective:   BP 110/76  Ht 5\' 5"  (1.651 m)  Wt 201 lb 12.8 oz (91.536 kg)  BMI 33.58 kg/m2 NAD. Alert, oriented. Lungs clear. Heart regular rate rhythm.  Assessment:  Problem List Items Addressed This Visit     Endocrine   Hyperinsulinemia - Primary     Other   Morbid obesity   Relevant Medications      phentermine (ADIPEX-P) 37.5 MG tablet    Other Visit Diagnoses   Need for vaccination        Relevant Orders       TB Skin Test (Completed)       Plan:  Meds ordered this encounter  Medications  . phentermine (ADIPEX-P) 37.5 MG tablet    Sig: Take 1 tablet (37.5 mg total) by mouth daily before breakfast.    Dispense:  30 tablet    Refill:  2    Order Specific Question:  Supervising Provider    Answer:  Merlyn AlbertLUKING, WILLIAM S [[id number]]   Encouraged healthy diet and regular activity. Return in about 3 months (around 02/27/2014). Reminded patient that her medication should not be taken if she becomes pregnant. Has no plans for pregnancy at this time.

## 2013-12-03 ENCOUNTER — Emergency Department (HOSPITAL_COMMUNITY)
Admission: EM | Admit: 2013-12-03 | Discharge: 2013-12-03 | Disposition: A | Discharge status: Home / Self Care | Payer: BC Managed Care – PPO | Attending: Emergency Medicine | Admitting: Emergency Medicine

## 2013-12-03 ENCOUNTER — Encounter (HOSPITAL_COMMUNITY): Payer: Self-pay | Admitting: Emergency Medicine

## 2013-12-03 DIAGNOSIS — B029 Zoster without complications: Secondary | ICD-10-CM | POA: Diagnosis not present

## 2013-12-03 DIAGNOSIS — Z79899 Other long term (current) drug therapy: Secondary | ICD-10-CM | POA: Diagnosis not present

## 2013-12-03 DIAGNOSIS — R21 Rash and other nonspecific skin eruption: Secondary | ICD-10-CM | POA: Diagnosis present

## 2013-12-03 MED ORDER — ONDANSETRON HCL 4 MG PO TABS
4.0000 mg | ORAL_TABLET | Freq: Once | ORAL | Status: AC
  Administered 2013-12-03: 4 mg via ORAL
  Filled 2013-12-03: qty 1

## 2013-12-03 MED ORDER — ACYCLOVIR 800 MG PO TABS
800.0000 mg | ORAL_TABLET | Freq: Once | ORAL | Status: AC
  Administered 2013-12-03: 800 mg via ORAL
  Filled 2013-12-03: qty 1

## 2013-12-03 MED ORDER — FAMCICLOVIR 500 MG PO TABS: 500.0000 mg | ORAL_TABLET | Freq: Three times a day (TID) | ORAL | Status: DC

## 2013-12-03 MED ORDER — HYDROCODONE-ACETAMINOPHEN 7.5-325 MG PO TABS: 1.0000 | ORAL_TABLET | ORAL | Status: DC | PRN

## 2013-12-03 NOTE — ED Provider Notes (Signed)
CSN: [id number]     Arrival date & time 12/03/13  1651 History  This chart was scribed for Gina QualeHobson Alonnah Lampkins, PA-C, working with Geoffery Lyonsouglas Delo, MD by Chestine SporeSoijett Blue, ED Scribe. The patient was seen in room APFT21/APFT21 at 7:37 PM.      Chief Complaint  Patient presents with  . Rash     The history is provided by the patient. No language interpreter was used.   Gina Marquez is a 33 y.o. female who presents today complaining of rash onset 3 days. She states that the area kept itching and stinging. She states that her shoulder began to hurt. She states that she has muscle spasms, that typically start from her neck and go down. She states that the rash started on her right side of her back and is now on the right side of her chest. She states that the bumps came yesterday night. She states that she has tried using a cream with no relief for her symptoms. She denies runny nose, sore throat, congestion, and any other symptoms.   She denies changing detergent or eating differently. She states that she had a TB test done earlier this week. She states that she has polycystic ovarian syndrome and she is taking metformin for it. She states that she was informed that she was borderline DM. She voices concern for if someone who hasn't had chicken pox catch what she has. 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 She voices concern for what to do if she becomes pregnant, since her husband and her are trying currently.  History reviewed. No pertinent past medical history. History reviewed. No pertinent past surgical history. Family History  Problem Relation Age of Onset  . Hypertension Mother   . Diabetes Mother   . Heart attack Mother   . Heart disease Mother [id number]    MI  . Heart disease Maternal Grandfather   . Diabetes Maternal Grandfather   . Diabetes Maternal Aunt   . Diabetes Maternal Aunt    History  Substance Use Topics  . Smoking status: Never Smoker   . Smokeless tobacco: Never Used  . Alcohol  Use: No   OB History   Grav Para Term Preterm Abortions TAB SAB Ect Mult Living                 Review of Systems  Skin: Positive for rash.  All other systems reviewed and are negative.     Allergies  Codeine  Home Medications   Prior to Admission medications   Medication Sig Start Date End Date Taking? Authorizing Provider  diazepam (VALIUM) 10 MG tablet One po qhs prn muscle spasms 10/27/13   Campbell Richesarolyn C Hoskins, NP  metFORMIN (GLUCOPHAGE) 500 MG tablet Take 1 tablet (500 mg total) by mouth 2 (two) times daily with a meal. 06/14/13   Campbell Richesarolyn C Hoskins, NP  phentermine (ADIPEX-P) 37.5 MG tablet Take 1 tablet (37.5 mg total) by mouth daily before breakfast. 11/27/13   Campbell Richesarolyn C Hoskins, NP   BP 108/76  Pulse 95  Temp(Src) 98.4 F (36.9 C) (Oral)  Resp 16  Ht 5\' 5"  (1.651 m)  Wt 198 lb (89.812 kg)  BMI 32.95 kg/m2  SpO2 100%  LMP 11/15/2013  Physical Exam  Nursing note and vitals reviewed. Constitutional: She is oriented to person, place, and time. She appears well-developed and well-nourished. No distress.  HENT:  Head: Normocephalic and atraumatic.  Eyes: EOM are normal.  Neck: Neck supple. No tracheal deviation present.  Cardiovascular: Normal rate.   Pulmonary/Chest: Effort normal. No respiratory distress.  Musculoskeletal: Normal range of motion.  Neurological: She is alert and oriented to person, place, and time.  Skin: Skin is warm and dry.  Cluster of red raised bumps, some with blisters on the posterior humerus area extending toward the axilla. Tender to palpation.   Psychiatric: She has a normal mood and affect. Her behavior is normal.    ED Course  Procedures (including critical care time) DIAGNOSTIC STUDIES: Oxygen Saturation is 100% on room air, normal by my interpretation.    COORDINATION OF CARE: 7:51 PM-Discussed treatment plan which includes acyclovir, Benadryl cream, benadryl liquid, follow up with Dr. Gerda DissLuking with pt at bedside and pt agreed to  plan.   Labs Review Labs Reviewed - No data to display  Imaging Review No results found.   EKG Interpretation None      MDM  Rash and symptoms suggests shingles diagnosis. Patient is placed on Famvir 500 mg, and Norco 7.5 mg. Patient is to follow with primary care physician.    Final diagnoses:  None    **I have reviewed nursing notes, vital signs, and all appropriate lab and imaging results for this patient.*  I personally performed the services described in this documentation, which was scribed in my presence. The recorded information has been reviewed and is accurate.    Kathie DikeHobson M Trinette Vera, PA-C 12/04/13 1540

## 2013-12-03 NOTE — ED Notes (Addendum)
Pt reports rash x2 days. Pt reports itching burning sensation. Pt reports started on right side of her back and is now on right side of chest. Site is red,raised. No drainage or blistering noted.

## 2013-12-03 NOTE — Discharge Instructions (Signed)
Your rash is consistent with shingles (herpes zoster). Please use family or 3 times daily with meals. Use Benadryl ointment for comfort. May also use Benadryl orally for itching or discomfort. Use Tylenol every 4 hours or ibuprofen every 6 hours if needed for fever or aching. Please see Dr.Luking, or return to the emergency department if any changes in condition. Shingles Shingles (herpes zoster) is an infection that is caused by the same virus that causes chickenpox (varicella). The infection causes a painful skin rash and fluid-filled blisters, which eventually break open, crust over, and heal. It may occur in any area of the body, but it usually affects only one side of the body or face. The pain of shingles usually lasts about 1 month. However, some people with shingles may develop long-term (chronic) pain in the affected area of the body. Shingles often occurs many years after the person had chickenpox. It is more common:  In people older than 50 years.  In people with weakened immune systems, such as those with HIV, AIDS, or cancer.  In people taking medicines that weaken the immune system, such as transplant medicines.  In people under great stress. CAUSES  Shingles is caused by the varicella zoster virus (VZV), which also causes chickenpox. After a person is infected with the virus, it can remain in the person's body for years in an inactive state (dormant). To cause shingles, the virus reactivates and breaks out as an infection in a nerve root. The virus can be spread from person to person (contagious) through contact with open blisters of the shingles rash. It will only spread to people who have not had chickenpox. When these people are exposed to the virus, they may develop chickenpox. They will not develop shingles. Once the blisters scab over, the person is no longer contagious and cannot spread the virus to others. SIGNS AND SYMPTOMS  Shingles shows up in stages. The initial symptoms  may be pain, itching, and tingling in an area of the skin. This pain is usually described as burning, stabbing, or throbbing.In a few days or weeks, a painful red rash will appear in the area where the pain, itching, and tingling were felt. The rash is usually on one side of the body in a band or belt-like pattern. Then, the rash usually turns into fluid-filled blisters. They will scab over and dry up in approximately 2-3 weeks. Flu-like symptoms may also occur with the initial symptoms, the rash, or the blisters. These may include:  Fever.  Chills.  Headache.  Upset stomach. DIAGNOSIS  Your health care provider will perform a skin exam to diagnose shingles. Skin scrapings or fluid samples may also be taken from the blisters. This sample will be examined under a microscope or sent to a lab for further testing. TREATMENT  There is no specific cure for shingles. Your health care provider will likely prescribe medicines to help you manage the pain, recover faster, and avoid long-term problems. This may include antiviral drugs, anti-inflammatory drugs, and pain medicines. HOME CARE INSTRUCTIONS   Take a cool bath or apply cool compresses to the area of the rash or blisters as directed. This may help with the pain and itching.   Take medicines only as directed by your health care provider.   Rest as directed by your health care provider.  Keep your rash and blisters clean with mild soap and cool water or as directed by your health care provider.  Do not pick your blisters or scratch your  rash. Apply an anti-itch cream or numbing creams to the affected area as directed by your health care provider.  Keep your shingles rash covered with a loose bandage (dressing).  Avoid skin contact with:  Babies.   Pregnant women.   Children with eczema.   Elderly people with transplants.   People with chronic illnesses, such as leukemia or AIDS.   Wear loose-fitting clothing to help ease  the pain of material rubbing against the rash.  Keep all follow-up visits as directed by your health care provider.If the area involved is on your face, you may receive a referral for a specialist, such as an eye doctor (ophthalmologist) or an ear, nose, and throat (ENT) doctor. Keeping all follow-up visits will help you avoid eye problems, chronic pain, or disability.  SEEK IMMEDIATE MEDICAL CARE IF:   You have facial pain, pain around the eye area, or loss of feeling on one side of your face.  You have ear pain or ringing in your ear.  You have loss of taste.  Your pain is not relieved with prescribed medicines.   Your redness or swelling spreads.   You have more pain and swelling.  Your condition is worsening or has changed.   You have a fever. MAKE SURE YOU:  Understand these instructions.  Will watch your condition.  Will get help right away if you are not doing well or get worse. Document Released: 02/09/2005 Document Revised: 06/26/2013 Document Reviewed: 09/24/2011 [facility] Patient Information 2015 [place], [place]. This information is not intended to replace advice given to you by your health care provider. Make sure you discuss any questions you have with your health care provider.

## 2013-12-03 NOTE — ED Notes (Signed)
Discharge instructions given, pt demonstrated teach back and verbal understanding. No concerns voiced.  

## 2013-12-05 NOTE — ED Provider Notes (Signed)
Medical screening examination/treatment/procedure(s) were performed by non-physician practitioner and as supervising physician I was immediately available for consultation/collaboration.     Brittinee Risk, MD 12/05/13 0730 

## 2013-12-07 ENCOUNTER — Telehealth: Payer: Self-pay | Admitting: *Deleted

## 2013-12-07 NOTE — Telephone Encounter (Signed)
May return to work Monday, if unable we can extend, with shingles it is no longer contagious if scabbed, also if keep covered then children are not at risk. If oozing then will need to stay out longer

## 2013-12-07 NOTE — Telephone Encounter (Signed)
Discussed with pt

## 2013-12-07 NOTE — Telephone Encounter (Signed)
Pt seen in the ED on 12/03/13. Diagnosed with shingles. Prescribed famciclovir 500mg  one tid for 7 days and hydrocodone for pain. 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 Pt states the spots are drying up and starting to scab but still itching inside. Would like to know if it will be ok to return to work on Monday or if she should stay out longer.

## 2014-04-11 ENCOUNTER — Ambulatory Visit (INDEPENDENT_AMBULATORY_CARE_PROVIDER_SITE_OTHER): Payer: BLUE CROSS/BLUE SHIELD | Admitting: Family Medicine

## 2014-04-11 ENCOUNTER — Encounter: Payer: Self-pay | Admitting: Family Medicine

## 2014-04-11 VITALS — BP 116/84 | Ht 65.0 in | Wt 190.0 lb

## 2014-04-11 DIAGNOSIS — L500 Allergic urticaria: Secondary | ICD-10-CM | POA: Diagnosis not present

## 2014-04-11 MED ORDER — PREDNISONE 20 MG PO TABS
ORAL_TABLET | ORAL | Status: DC
Start: 2014-04-11 — End: 2014-04-25

## 2014-04-11 MED ORDER — PHENTERMINE HCL 37.5 MG PO TABS: 37.5000 mg | ORAL_TABLET | Freq: Every day | ORAL | Status: DC

## 2014-04-11 MED ORDER — METHYLPREDNISOLONE ACETATE 40 MG/ML IJ SUSP
40.0000 mg | Freq: Once | INTRAMUSCULAR | Status: AC
  Administered 2014-04-11: 40 mg via INTRAMUSCULAR

## 2014-04-11 MED ORDER — EPINEPHRINE 0.3 MG/0.3ML IJ SOAJ: 0.3000 mg | Freq: Once | INTRAMUSCULAR | Status: DC

## 2014-04-11 NOTE — Patient Instructions (Signed)
Loratadine 10mg  ( generic of Claritin) one daily ongoing

## 2014-04-11 NOTE — Progress Notes (Signed)
   Subjective:    Patient ID: Gina Marquez, female    DOB: 04/07/1980, 34 y.o.   MRN: [id number]  HPI Rash on chest, neck stomach & thighs (welp like). Developed 3 days ago.  Tried Benadryl, powder & cold cloths & cream. Only thing different she has tried over the [employer] holiday was cooked mushrooms & passion fruit juice.  We did discuss how even in the best of circumstances evaluation by an allergist will your out the cause of this in approximately 50% of the cases. Review of Systems    relates itching tingling in the skin up her back arms legs denies shortness of breath denies difficulty swallowing or speaking Objective:   Physical Exam  Lungs clear hearts regular extensive excoriations on the upper back and on the arms plus also intermittent angioedema. Airway is normal patient not in distress      Assessment & Plan:  Urticaria unknown cause EpiPen improper way to use it plus calling 911 if using it was discussed Prednisone taper Loratadine daily Benadryl when necessary If worse follow-up Work excuse for 2 days Offered referral to allergist currently patient will think about

## 2014-04-25 ENCOUNTER — Ambulatory Visit (INDEPENDENT_AMBULATORY_CARE_PROVIDER_SITE_OTHER): Payer: BLUE CROSS/BLUE SHIELD | Admitting: Family Medicine

## 2014-04-25 ENCOUNTER — Encounter: Payer: Self-pay | Admitting: Family Medicine

## 2014-04-25 VITALS — Temp 98.6°F | Ht 65.0 in | Wt 192.8 lb

## 2014-04-25 DIAGNOSIS — J329 Chronic sinusitis, unspecified: Secondary | ICD-10-CM | POA: Diagnosis not present

## 2014-04-25 DIAGNOSIS — L500 Allergic urticaria: Secondary | ICD-10-CM

## 2014-04-25 MED ORDER — AMOXICILLIN 500 MG PO CAPS: 500.0000 mg | ORAL_CAPSULE | Freq: Three times a day (TID) | ORAL | Status: DC

## 2014-04-25 NOTE — Progress Notes (Signed)
   Subjective:    Patient ID: Gina Marquez, female    DOB: [birth year], 34 y.o.   MRN: [id number]  Sinus Problem This is a new problem. The current episode started in the past 7 days. Associated symptoms include coughing, ear pain, headaches and a sore throat. Treatments tried: allergy med.   Cong and drainage int the throat  Left ear hurting felt a bit dizzy  achey and headache left sided  No sig fever, hawking up stuff  Sig exposures to sick kids  Review of Systems  HENT: Positive for ear pain and sore throat.   Respiratory: Positive for cough.   Neurological: Positive for headaches.       Objective:   Physical Exam  Alert moderate malaise H&T moderate his congestion frontal tenderness pharynx normal neck supple lungs clear heart rate and rhythm.      Assessment & Plan:  Impression acute rhinosinusitis plan antibiotics prescribed. Symptomatic care discussed. Allergy referral did per patient request see prior note [facility]

## 2014-06-20 ENCOUNTER — Ambulatory Visit (INDEPENDENT_AMBULATORY_CARE_PROVIDER_SITE_OTHER): Payer: BLUE CROSS/BLUE SHIELD | Admitting: Nurse Practitioner

## 2014-06-20 ENCOUNTER — Encounter: Payer: Self-pay | Admitting: Nurse Practitioner

## 2014-06-20 VITALS — BP 122/84 | Ht 64.5 in | Wt 192.8 lb

## 2014-06-20 DIAGNOSIS — E161 Other hypoglycemia: Secondary | ICD-10-CM | POA: Diagnosis not present

## 2014-06-20 DIAGNOSIS — Z01419 Encounter for gynecological examination (general) (routine) without abnormal findings: Secondary | ICD-10-CM

## 2014-06-20 DIAGNOSIS — Z0000 Encounter for general adult medical examination without abnormal findings: Secondary | ICD-10-CM

## 2014-06-20 DIAGNOSIS — L83 Acanthosis nigricans: Secondary | ICD-10-CM | POA: Diagnosis not present

## 2014-06-20 DIAGNOSIS — Z79899 Other long term (current) drug therapy: Secondary | ICD-10-CM | POA: Diagnosis not present

## 2014-06-20 DIAGNOSIS — E282 Polycystic ovarian syndrome: Secondary | ICD-10-CM

## 2014-06-20 DIAGNOSIS — Z124 Encounter for screening for malignant neoplasm of cervix: Secondary | ICD-10-CM

## 2014-06-20 DIAGNOSIS — R5383 Other fatigue: Secondary | ICD-10-CM

## 2014-06-22 ENCOUNTER — Encounter: Payer: Self-pay | Admitting: Nurse Practitioner

## 2014-06-22 LAB — PAP IG W/ RFLX HPV ASCU: PAP SMEAR COMMENT: 0

## 2014-06-22 NOTE — Progress Notes (Signed)
   Subjective:    Patient ID: Gina Marquez, female    DOB: [birth year], 34 y.o.   MRN: [id number]  HPI  presents for her wellness exam. Overall healthy diet. Regular exercise. Regular vision and dental exams. Has been off her birth control. Trying to conceive. Same sexual partner. Having a menses once a month lasting about 7 days, heavy for 4 days. Decided to stop taking metformin.   Review of Systems  Constitutional: Negative for activity change, appetite change and fatigue.  HENT: Negative for dental problem, ear pain, sinus pressure and sore throat.   Respiratory: Negative for cough, chest tightness, shortness of breath and wheezing.   Cardiovascular: Negative for chest pain.  Gastrointestinal: Negative for nausea, vomiting, abdominal pain, diarrhea, constipation and abdominal distention.  Genitourinary: Positive for menstrual problem. Negative for dysuria, urgency, frequency, vaginal discharge, enuresis, difficulty urinating, genital sores and pelvic pain.       Objective:   Physical Exam  Constitutional: She is oriented to person, place, and time. She appears well-developed. No distress.  HENT:  Right Ear: External ear normal.  Left Ear: External ear normal.  Mouth/Throat: Oropharynx is clear and moist.  Neck: Normal range of motion. Neck supple. No tracheal deviation present. No thyromegaly present.  Cardiovascular: Normal rate, regular rhythm and normal heart sounds.  Exam reveals no gallop.   No murmur heard. Pulmonary/Chest: Effort normal and breath sounds normal.  Abdominal: Soft. She exhibits no distension. There is no tenderness.  Genitourinary: Vagina normal and uterus normal. No vaginal discharge found.  External GU no rashes or lesions. Vagina no discharge. Cervix normal limit in appearance. No CMT. Bimanual exam no tenderness or obvious masses.  Musculoskeletal: She exhibits no edema.  Lymphadenopathy:    She has no cervical adenopathy.  Neurological: She is alert  and oriented to person, place, and time.  Skin: Skin is warm and dry. No rash noted.  Acanthosis nigricans posterior neck area  Psychiatric: She has a normal mood and affect. Her behavior is normal.  Vitals reviewed.  breast exam fine nodularity, no dominant masses; axilla no adenopathy.        Assessment & Plan:   Problem List Items Addressed This Visit      Endocrine   PCOS (polycystic ovarian syndrome)   Relevant Orders   Hemoglobin A1c   Hyperinsulinemia   Relevant Orders   Hemoglobin A1c     Musculoskeletal and Integument   Acanthosis nigricans     Other   Morbid obesity    Other Visit Diagnoses    Well woman exam    -  Primary    Relevant Orders    Lipid panel    Screening for cervical cancer        Relevant Orders    Pap IG w/ reflex to HPV when ASC-U    Other fatigue        Relevant Orders    Basic metabolic panel    TSH    Vit D  25 hydroxy (rtn osteoporosis monitoring)    High risk medication use        Relevant Orders    Hepatic function panel      Labs pending. Recommend weight loss healthy diet and regular exercise. Daily vitamin D and calcium supplementation. Will refer her to fertility specialist per her request. Return in about 1 year (around 06/20/2015).

## 2014-06-26 ENCOUNTER — Encounter: Payer: Self-pay | Admitting: Family Medicine

## 2014-06-29 ENCOUNTER — Other Ambulatory Visit: Payer: Self-pay | Admitting: Nurse Practitioner

## 2014-06-30 IMAGING — CR DG CERVICAL SPINE 2 OR 3 VIEWS
4 series · 4 of 4 positions shown · non-contrast
Comparison: None

CLINICAL DATA: back pain

CERVICAL SPINE - 2-3 VIEW

[view not recorded (1 of 4)]
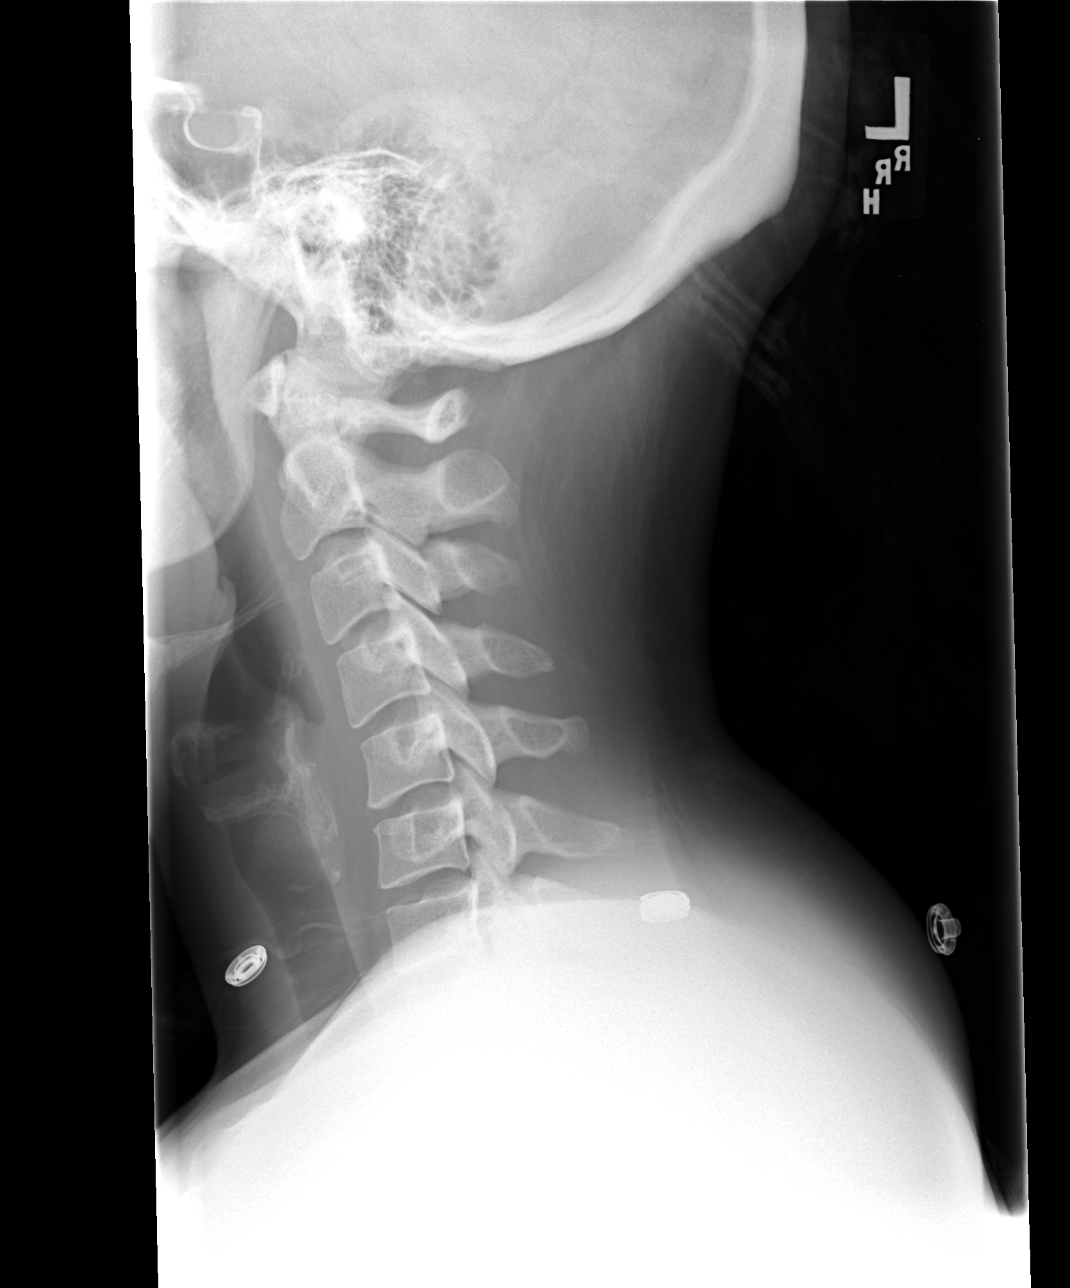

[view not recorded (2 of 4)]
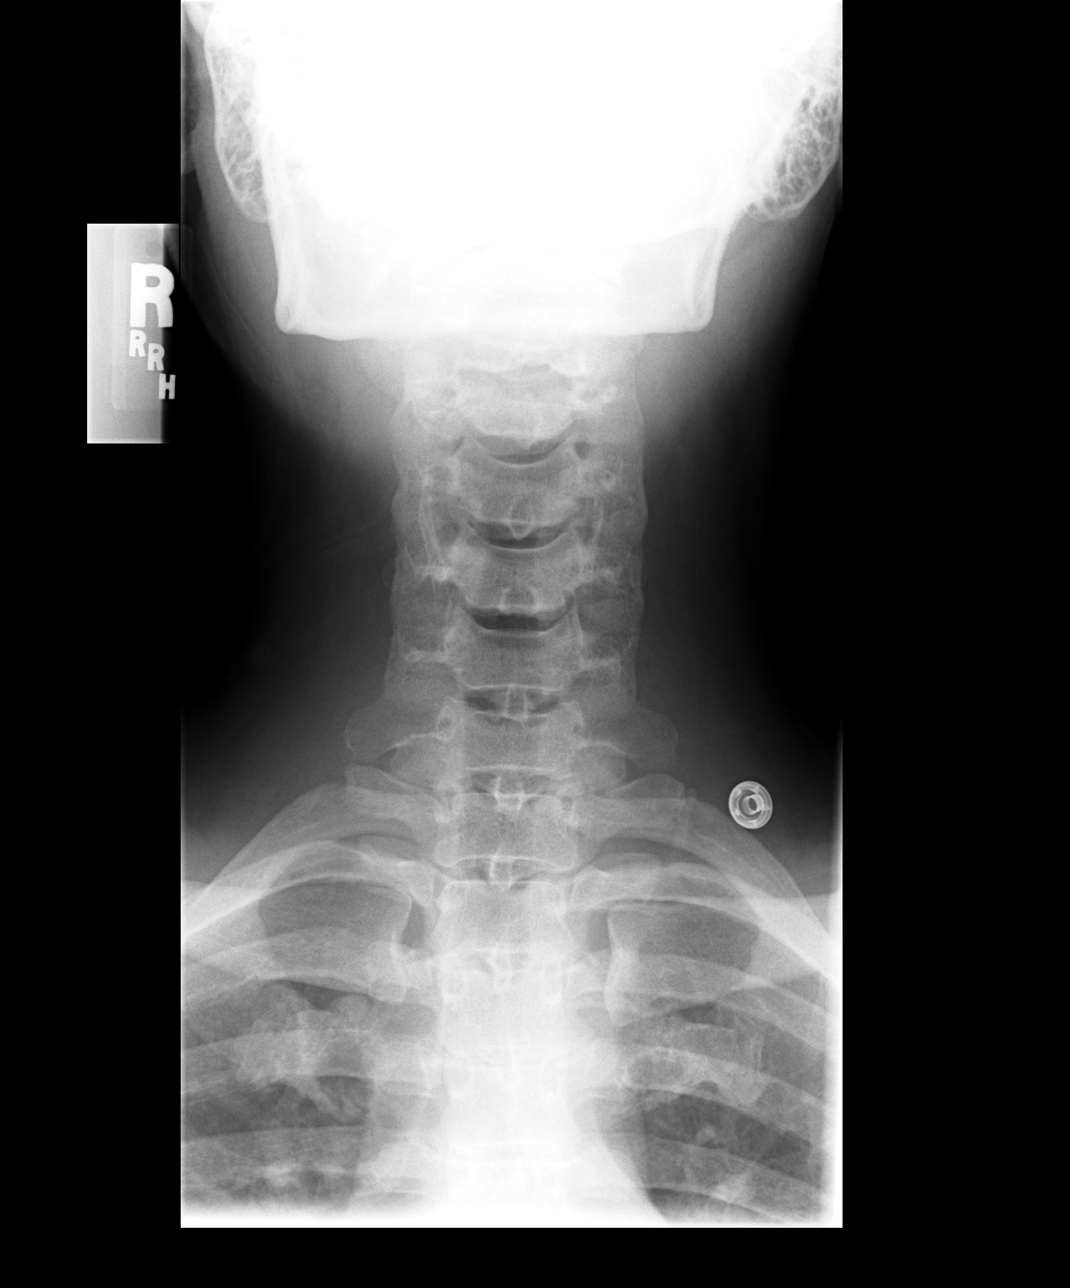

[view not recorded (3 of 4)]
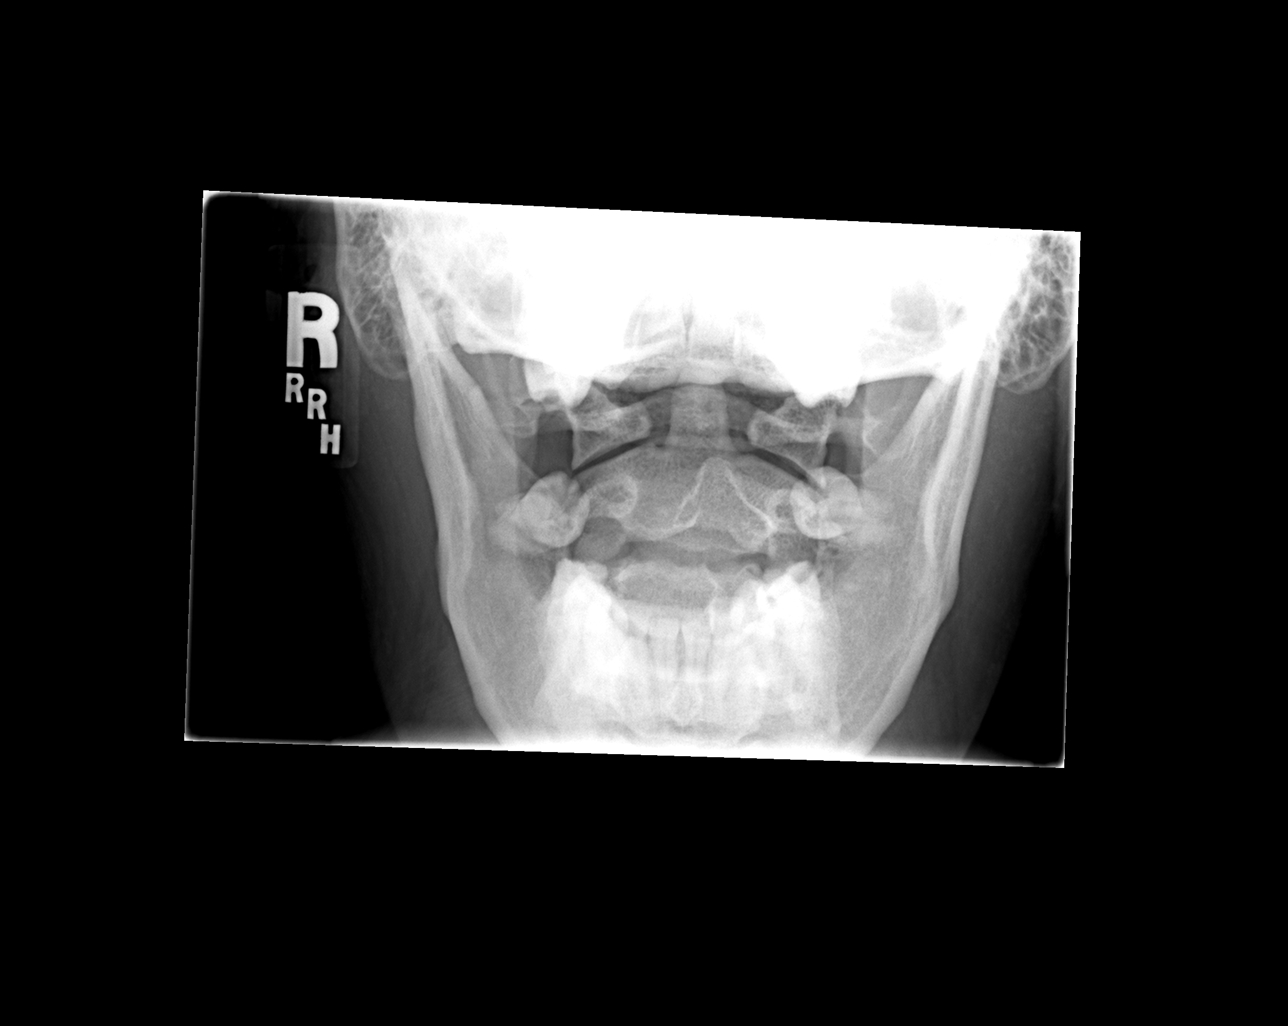

[view not recorded (4 of 4)]
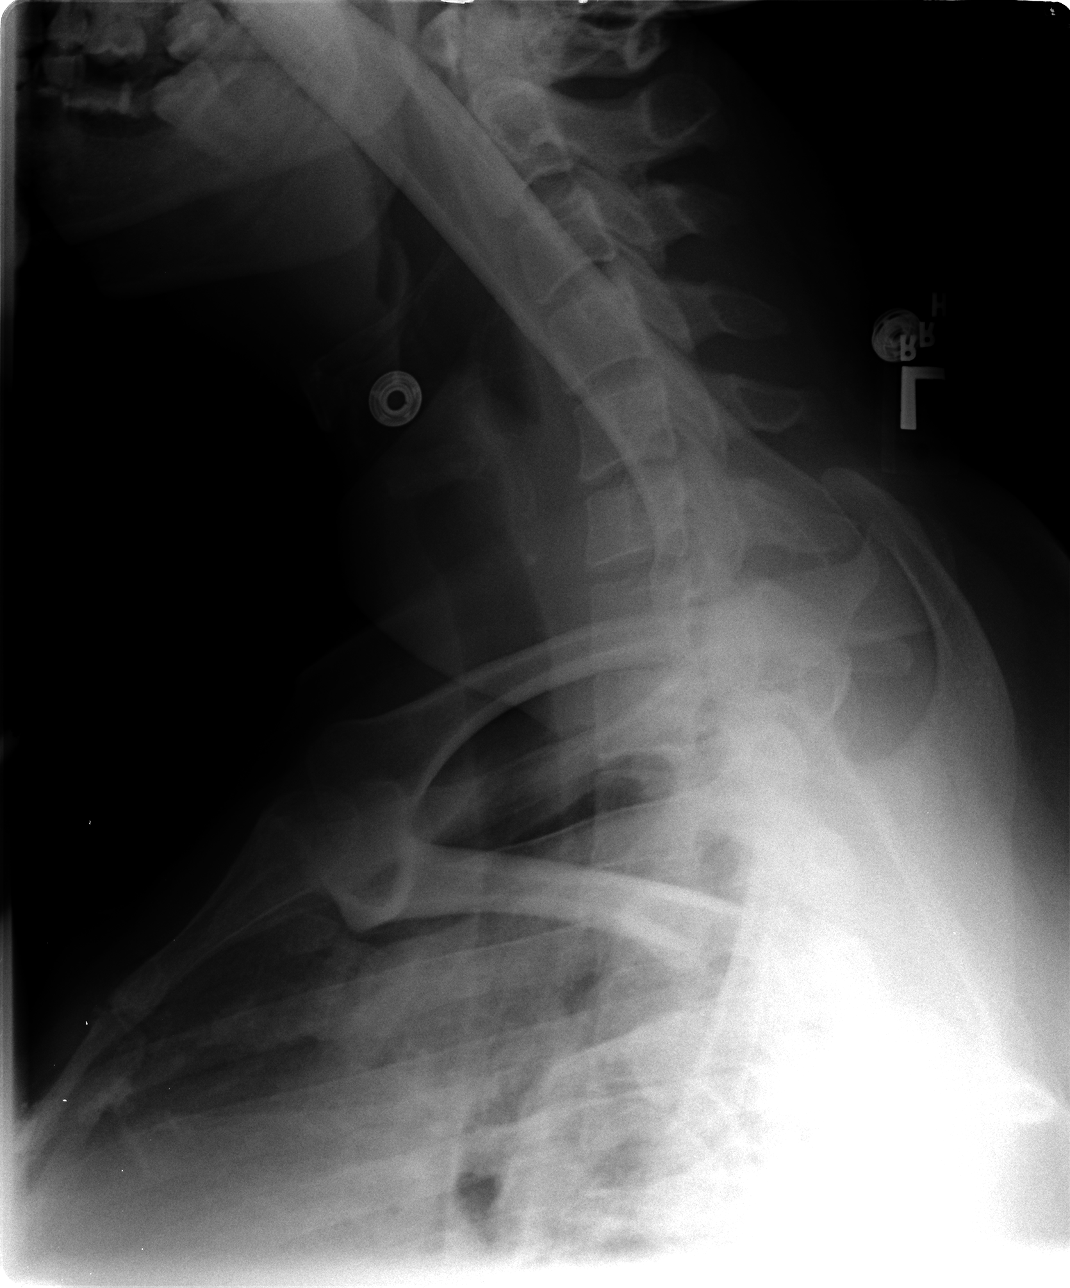

[4 of 4 positions shown; findings below may reference images not displayed]

FINDINGS: There is reversal of normal cervical lordosis.  The
vertebral body heights are well preserved.  The spaces are within
normal limits.  No prevertebral soft tissue swelling.
IMPRESSION: 1.  No fractures or subluxations.
2.  Reversal of normal cervical lordosis which may reflect patient
positioning or muscle spasm.

## 2014-10-31 ENCOUNTER — Ambulatory Visit (INDEPENDENT_AMBULATORY_CARE_PROVIDER_SITE_OTHER): Payer: BLUE CROSS/BLUE SHIELD | Admitting: *Deleted

## 2014-10-31 DIAGNOSIS — Z111 Encounter for screening for respiratory tuberculosis: Secondary | ICD-10-CM | POA: Diagnosis not present

## 2014-11-02 LAB — TB SKIN TEST
INDURATION: 0 mm
TB SKIN TEST: NEGATIVE

## 2014-11-15 DIAGNOSIS — L501 Idiopathic urticaria: Secondary | ICD-10-CM

## 2014-11-15 DIAGNOSIS — H1010 Acute atopic conjunctivitis, unspecified eye: Secondary | ICD-10-CM | POA: Insufficient documentation

## 2014-11-15 DIAGNOSIS — J302 Other seasonal allergic rhinitis: Principal | ICD-10-CM

## 2014-11-15 DIAGNOSIS — J3089 Other allergic rhinitis: Principal | ICD-10-CM

## 2014-11-20 ENCOUNTER — Ambulatory Visit (INDEPENDENT_AMBULATORY_CARE_PROVIDER_SITE_OTHER): Payer: BLUE CROSS/BLUE SHIELD

## 2014-11-20 DIAGNOSIS — J309 Allergic rhinitis, unspecified: Secondary | ICD-10-CM | POA: Diagnosis not present

## 2014-11-27 ENCOUNTER — Ambulatory Visit (INDEPENDENT_AMBULATORY_CARE_PROVIDER_SITE_OTHER): Payer: BLUE CROSS/BLUE SHIELD

## 2014-11-27 DIAGNOSIS — J301 Allergic rhinitis due to pollen: Secondary | ICD-10-CM

## 2014-12-11 ENCOUNTER — Ambulatory Visit (INDEPENDENT_AMBULATORY_CARE_PROVIDER_SITE_OTHER): Payer: BLUE CROSS/BLUE SHIELD

## 2014-12-11 DIAGNOSIS — J301 Allergic rhinitis due to pollen: Secondary | ICD-10-CM | POA: Diagnosis not present

## 2014-12-18 ENCOUNTER — Ambulatory Visit (INDEPENDENT_AMBULATORY_CARE_PROVIDER_SITE_OTHER): Payer: BLUE CROSS/BLUE SHIELD

## 2014-12-18 DIAGNOSIS — J301 Allergic rhinitis due to pollen: Secondary | ICD-10-CM | POA: Diagnosis not present

## 2014-12-25 ENCOUNTER — Ambulatory Visit (INDEPENDENT_AMBULATORY_CARE_PROVIDER_SITE_OTHER): Payer: BLUE CROSS/BLUE SHIELD

## 2014-12-25 DIAGNOSIS — J301 Allergic rhinitis due to pollen: Secondary | ICD-10-CM

## 2015-01-01 ENCOUNTER — Ambulatory Visit (INDEPENDENT_AMBULATORY_CARE_PROVIDER_SITE_OTHER): Payer: BLUE CROSS/BLUE SHIELD

## 2015-01-01 DIAGNOSIS — J301 Allergic rhinitis due to pollen: Secondary | ICD-10-CM | POA: Diagnosis not present

## 2015-01-08 ENCOUNTER — Ambulatory Visit (INDEPENDENT_AMBULATORY_CARE_PROVIDER_SITE_OTHER): Payer: BLUE CROSS/BLUE SHIELD

## 2015-01-08 DIAGNOSIS — J301 Allergic rhinitis due to pollen: Secondary | ICD-10-CM

## 2015-01-22 ENCOUNTER — Ambulatory Visit (INDEPENDENT_AMBULATORY_CARE_PROVIDER_SITE_OTHER): Payer: BLUE CROSS/BLUE SHIELD

## 2015-01-22 DIAGNOSIS — J309 Allergic rhinitis, unspecified: Secondary | ICD-10-CM

## 2015-01-29 ENCOUNTER — Ambulatory Visit (INDEPENDENT_AMBULATORY_CARE_PROVIDER_SITE_OTHER): Payer: BLUE CROSS/BLUE SHIELD

## 2015-01-29 DIAGNOSIS — J309 Allergic rhinitis, unspecified: Secondary | ICD-10-CM

## 2015-02-05 ENCOUNTER — Ambulatory Visit (INDEPENDENT_AMBULATORY_CARE_PROVIDER_SITE_OTHER): Payer: BLUE CROSS/BLUE SHIELD

## 2015-02-05 DIAGNOSIS — J309 Allergic rhinitis, unspecified: Secondary | ICD-10-CM | POA: Diagnosis not present

## 2015-02-19 ENCOUNTER — Ambulatory Visit (INDEPENDENT_AMBULATORY_CARE_PROVIDER_SITE_OTHER): Payer: BLUE CROSS/BLUE SHIELD

## 2015-02-19 DIAGNOSIS — J309 Allergic rhinitis, unspecified: Secondary | ICD-10-CM

## 2015-02-26 ENCOUNTER — Ambulatory Visit (INDEPENDENT_AMBULATORY_CARE_PROVIDER_SITE_OTHER): Payer: BLUE CROSS/BLUE SHIELD | Admitting: Neurology

## 2015-02-26 DIAGNOSIS — J309 Allergic rhinitis, unspecified: Secondary | ICD-10-CM | POA: Diagnosis not present

## 2015-03-05 ENCOUNTER — Ambulatory Visit (INDEPENDENT_AMBULATORY_CARE_PROVIDER_SITE_OTHER): Payer: BLUE CROSS/BLUE SHIELD | Admitting: Neurology

## 2015-03-05 DIAGNOSIS — J309 Allergic rhinitis, unspecified: Secondary | ICD-10-CM

## 2015-03-19 ENCOUNTER — Ambulatory Visit (INDEPENDENT_AMBULATORY_CARE_PROVIDER_SITE_OTHER): Payer: BLUE CROSS/BLUE SHIELD

## 2015-03-19 DIAGNOSIS — J309 Allergic rhinitis, unspecified: Secondary | ICD-10-CM | POA: Diagnosis not present

## 2015-03-26 ENCOUNTER — Ambulatory Visit (INDEPENDENT_AMBULATORY_CARE_PROVIDER_SITE_OTHER): Payer: BLUE CROSS/BLUE SHIELD

## 2015-03-26 DIAGNOSIS — J309 Allergic rhinitis, unspecified: Secondary | ICD-10-CM

## 2015-04-02 ENCOUNTER — Ambulatory Visit (INDEPENDENT_AMBULATORY_CARE_PROVIDER_SITE_OTHER): Payer: BLUE CROSS/BLUE SHIELD

## 2015-04-02 DIAGNOSIS — J309 Allergic rhinitis, unspecified: Secondary | ICD-10-CM | POA: Diagnosis not present

## 2015-04-09 ENCOUNTER — Ambulatory Visit (INDEPENDENT_AMBULATORY_CARE_PROVIDER_SITE_OTHER): Payer: BLUE CROSS/BLUE SHIELD

## 2015-04-09 DIAGNOSIS — J309 Allergic rhinitis, unspecified: Secondary | ICD-10-CM | POA: Diagnosis not present

## 2015-04-23 ENCOUNTER — Ambulatory Visit (INDEPENDENT_AMBULATORY_CARE_PROVIDER_SITE_OTHER): Payer: BLUE CROSS/BLUE SHIELD

## 2015-04-23 DIAGNOSIS — J309 Allergic rhinitis, unspecified: Secondary | ICD-10-CM | POA: Diagnosis not present

## 2015-04-30 ENCOUNTER — Ambulatory Visit (INDEPENDENT_AMBULATORY_CARE_PROVIDER_SITE_OTHER): Payer: BLUE CROSS/BLUE SHIELD

## 2015-04-30 DIAGNOSIS — J309 Allergic rhinitis, unspecified: Secondary | ICD-10-CM

## 2015-05-07 ENCOUNTER — Ambulatory Visit (INDEPENDENT_AMBULATORY_CARE_PROVIDER_SITE_OTHER): Payer: BLUE CROSS/BLUE SHIELD

## 2015-05-07 DIAGNOSIS — J309 Allergic rhinitis, unspecified: Secondary | ICD-10-CM | POA: Diagnosis not present

## 2015-05-14 ENCOUNTER — Ambulatory Visit (INDEPENDENT_AMBULATORY_CARE_PROVIDER_SITE_OTHER): Payer: BLUE CROSS/BLUE SHIELD

## 2015-05-14 DIAGNOSIS — J309 Allergic rhinitis, unspecified: Secondary | ICD-10-CM | POA: Diagnosis not present

## 2015-05-21 ENCOUNTER — Ambulatory Visit (INDEPENDENT_AMBULATORY_CARE_PROVIDER_SITE_OTHER): Payer: BLUE CROSS/BLUE SHIELD

## 2015-05-21 DIAGNOSIS — J309 Allergic rhinitis, unspecified: Secondary | ICD-10-CM

## 2015-05-28 ENCOUNTER — Ambulatory Visit (INDEPENDENT_AMBULATORY_CARE_PROVIDER_SITE_OTHER): Payer: BLUE CROSS/BLUE SHIELD

## 2015-05-28 DIAGNOSIS — J309 Allergic rhinitis, unspecified: Secondary | ICD-10-CM | POA: Diagnosis not present

## 2015-06-04 ENCOUNTER — Ambulatory Visit (INDEPENDENT_AMBULATORY_CARE_PROVIDER_SITE_OTHER): Payer: BLUE CROSS/BLUE SHIELD

## 2015-06-04 DIAGNOSIS — J309 Allergic rhinitis, unspecified: Secondary | ICD-10-CM | POA: Diagnosis not present

## 2015-06-11 ENCOUNTER — Ambulatory Visit (INDEPENDENT_AMBULATORY_CARE_PROVIDER_SITE_OTHER): Payer: BLUE CROSS/BLUE SHIELD

## 2015-06-11 DIAGNOSIS — J309 Allergic rhinitis, unspecified: Secondary | ICD-10-CM

## 2015-06-13 ENCOUNTER — Other Ambulatory Visit: Payer: Self-pay | Admitting: *Deleted

## 2015-06-13 MED ORDER — EPINEPHRINE 0.3 MG/0.3ML IJ SOAJ: 0.3000 mg | Freq: Once | INTRAMUSCULAR | Status: DC

## 2015-06-18 ENCOUNTER — Ambulatory Visit (INDEPENDENT_AMBULATORY_CARE_PROVIDER_SITE_OTHER): Payer: BLUE CROSS/BLUE SHIELD

## 2015-06-18 DIAGNOSIS — J309 Allergic rhinitis, unspecified: Secondary | ICD-10-CM

## 2015-07-02 ENCOUNTER — Ambulatory Visit (INDEPENDENT_AMBULATORY_CARE_PROVIDER_SITE_OTHER): Payer: BLUE CROSS/BLUE SHIELD | Admitting: *Deleted

## 2015-07-02 DIAGNOSIS — J309 Allergic rhinitis, unspecified: Secondary | ICD-10-CM

## 2015-07-17 DIAGNOSIS — J3089 Other allergic rhinitis: Secondary | ICD-10-CM | POA: Diagnosis not present

## 2015-07-18 DIAGNOSIS — J301 Allergic rhinitis due to pollen: Secondary | ICD-10-CM | POA: Diagnosis not present

## 2015-07-23 ENCOUNTER — Ambulatory Visit (INDEPENDENT_AMBULATORY_CARE_PROVIDER_SITE_OTHER): Payer: BLUE CROSS/BLUE SHIELD

## 2015-07-23 DIAGNOSIS — J309 Allergic rhinitis, unspecified: Secondary | ICD-10-CM | POA: Diagnosis not present

## 2015-07-30 ENCOUNTER — Ambulatory Visit (INDEPENDENT_AMBULATORY_CARE_PROVIDER_SITE_OTHER): Payer: BLUE CROSS/BLUE SHIELD

## 2015-07-30 DIAGNOSIS — J309 Allergic rhinitis, unspecified: Secondary | ICD-10-CM | POA: Diagnosis not present

## 2015-08-01 ENCOUNTER — Ambulatory Visit (INDEPENDENT_AMBULATORY_CARE_PROVIDER_SITE_OTHER): Payer: BLUE CROSS/BLUE SHIELD | Admitting: Nurse Practitioner

## 2015-08-01 ENCOUNTER — Encounter: Payer: Self-pay | Admitting: Nurse Practitioner

## 2015-08-01 VITALS — BP 118/82 | Ht 64.5 in | Wt 213.5 lb

## 2015-08-01 DIAGNOSIS — E282 Polycystic ovarian syndrome: Secondary | ICD-10-CM | POA: Diagnosis not present

## 2015-08-01 DIAGNOSIS — E161 Other hypoglycemia: Secondary | ICD-10-CM | POA: Diagnosis not present

## 2015-08-01 DIAGNOSIS — R7303 Prediabetes: Secondary | ICD-10-CM

## 2015-08-01 DIAGNOSIS — L83 Acanthosis nigricans: Secondary | ICD-10-CM

## 2015-08-01 LAB — POCT GLYCOSYLATED HEMOGLOBIN (HGB A1C): HEMOGLOBIN A1C: 6

## 2015-08-01 MED ORDER — NORGESTIMATE-ETH ESTRADIOL 0.25-35 MG-MCG PO TABS: 1.0000 | ORAL_TABLET | Freq: Every day | ORAL | Status: DC

## 2015-08-01 MED ORDER — METFORMIN HCL 500 MG PO TABS: ORAL_TABLET | ORAL | Status: DC

## 2015-08-03 ENCOUNTER — Encounter: Payer: Self-pay | Admitting: Nurse Practitioner

## 2015-08-03 DIAGNOSIS — R7303 Prediabetes: Secondary | ICD-10-CM | POA: Insufficient documentation

## 2015-08-03 NOTE — Progress Notes (Signed)
Subjective:  Presents to discuss contraceptives. Regular menses, normal flow but does have spotting for several days. Has had trouble with losing weight. Had considered having another baby but would require assistance with fertility due to husband's issues. At this time patient wishes to restart birth control pills. Also wants to try to restart metformin.  Objective:   BP 118/82 mmHg  Ht 5' 4.5" (1.638 m)  Wt 213 lb 8 oz (96.843 kg)  BMI 36.09 kg/m2 NAD. Alert, oriented. Lungs clear. Heart regular rate rhythm. Significant acanthosis nigricans noted in the posterior neck area. Large waist circumference. Results for orders placed or performed in visit on 08/01/15  POCT glycosylated hemoglobin (Hb A1C)  Result Value Ref Range   Hemoglobin A1C 6.0     Assessment:  Problem List Items Addressed This Visit      Endocrine   Hyperinsulinemia   Relevant Orders   POCT glycosylated hemoglobin (Hb A1C) (Completed)   PCOS (polycystic ovarian syndrome) - Primary     Musculoskeletal and Integument   Acanthosis nigricans     Other   Morbid obesity (HCC)   Relevant Medications   metFORMIN (GLUCOPHAGE) 500 MG tablet   Prediabetes       Plan:  Meds ordered this encounter  Medications  . norgestimate-ethinyl estradiol (ORTHO-CYCLEN,SPRINTEC,PREVIFEM) 0.25-35 MG-MCG tablet    Sig: Take 1 tablet by mouth daily.    Dispense:  1 Package    Refill:  11    Order Specific Question:  Supervising Provider    Answer:  Merlyn AlbertLUKING, WILLIAM S [[id number]]  . metFORMIN (GLUCOPHAGE) 500 MG tablet    Sig: TAKE ONE TABLET TWICE DAILY WITH A MEAL    Dispense:  60 tablet    Refill:  5    Order Specific Question:  Supervising Provider    Answer:  Riccardo DubinLUKING, WILLIAM S [[id number]]   Start Ortho-Cyclen as directed first Sunday after her next cycle begins. Use backup method first pack. This pill specifically was chosen to help her acne and counteract effects of PCOS. Discussed potential risk associated with OC use. Start  metformin as directed. Discussed importance of weight loss and regular activity. Recommend preventive health physical some time this year.

## 2015-08-06 ENCOUNTER — Ambulatory Visit (INDEPENDENT_AMBULATORY_CARE_PROVIDER_SITE_OTHER): Payer: BLUE CROSS/BLUE SHIELD | Admitting: *Deleted

## 2015-08-06 DIAGNOSIS — J309 Allergic rhinitis, unspecified: Secondary | ICD-10-CM | POA: Diagnosis not present

## 2015-08-13 ENCOUNTER — Ambulatory Visit (INDEPENDENT_AMBULATORY_CARE_PROVIDER_SITE_OTHER): Payer: BLUE CROSS/BLUE SHIELD

## 2015-08-13 DIAGNOSIS — J309 Allergic rhinitis, unspecified: Secondary | ICD-10-CM

## 2015-08-26 ENCOUNTER — Encounter: Payer: BLUE CROSS/BLUE SHIELD | Admitting: Nurse Practitioner

## 2015-09-19 ENCOUNTER — Encounter: Payer: Self-pay | Admitting: Nurse Practitioner

## 2015-09-19 ENCOUNTER — Ambulatory Visit (INDEPENDENT_AMBULATORY_CARE_PROVIDER_SITE_OTHER): Payer: BLUE CROSS/BLUE SHIELD | Admitting: Nurse Practitioner

## 2015-09-19 VITALS — BP 122/76 | Ht 65.0 in | Wt 208.4 lb

## 2015-09-19 DIAGNOSIS — Z0000 Encounter for general adult medical examination without abnormal findings: Secondary | ICD-10-CM

## 2015-09-19 DIAGNOSIS — Z01419 Encounter for gynecological examination (general) (routine) without abnormal findings: Secondary | ICD-10-CM

## 2015-09-19 MED ORDER — PHENTERMINE HCL 37.5 MG PO TABS: 37.5000 mg | ORAL_TABLET | Freq: Every day | ORAL | 2 refills | Status: DC

## 2015-09-21 ENCOUNTER — Encounter: Payer: Self-pay | Admitting: Nurse Practitioner

## 2015-09-21 NOTE — Progress Notes (Signed)
   Subjective:    Patient ID: Gina Marquez, female    DOB: August 20, 1980, 35 y.o.   MRN: [id number]  HPI presents for her wellness exam. Healthy diet. Regular exercise. Missed some oc's; restarted and now cycles are regular with normal flow. Regular vision and dental exams. Same sexual partner. Took phentermine without difficulty before. Would like to restart to jumpstart her weight loss.     Review of Systems  Constitutional: Negative for activity change, appetite change and fatigue.  HENT: Negative for dental problem, ear pain, sinus pressure and sore throat.   Respiratory: Negative for cough, chest tightness, shortness of breath and wheezing.   Cardiovascular: Negative for chest pain.  Gastrointestinal: Negative for abdominal distention, abdominal pain, constipation, diarrhea, nausea and vomiting.  Genitourinary: Negative for difficulty urinating, dysuria, enuresis, frequency, genital sores, menstrual problem, pelvic pain, urgency and vaginal discharge.       Objective:   Physical Exam  Constitutional: She is oriented to person, place, and time. She appears well-developed. No distress.  HENT:  Right Ear: External ear normal.  Left Ear: External ear normal.  Mouth/Throat: Oropharynx is clear and moist.  Neck: Normal range of motion. Neck supple. No tracheal deviation present. No thyromegaly present.  Cardiovascular: Normal rate, regular rhythm and normal heart sounds.  Exam reveals no gallop.   No murmur heard. Pulmonary/Chest: Effort normal and breath sounds normal.  Abdominal: Soft. She exhibits no distension. There is no tenderness.  Genitourinary: Vagina normal and uterus normal. No vaginal discharge found.  Genitourinary Comments: External GU: no rashes or lesions. Vagina: no discharge. Cervix normal in appearance. No CMT. Bimanual exam: no tenderness or obvious masses.  Musculoskeletal: She exhibits no edema.  Lymphadenopathy:    She has no cervical adenopathy.    Neurological: She is alert and oriented to person, place, and time.  Skin: Skin is warm and dry. No rash noted.  Psychiatric: She has a normal mood and affect. Her behavior is normal.  Vitals reviewed. Breast exam: areas of dense tissue; no masses; axillae no adenopathy.         Assessment & Plan:   Problem List Items Addressed This Visit      Other   Morbid obesity (HCC)   Relevant Medications   phentermine (ADIPEX-P) 37.5 MG tablet    Other Visit Diagnoses    Well woman exam    -  Primary     Meds ordered this encounter  Medications  . phentermine (ADIPEX-P) 37.5 MG tablet    Sig: Take 1 tablet (37.5 mg total) by mouth daily before breakfast.    Dispense:  30 tablet    Refill:  2    Order Specific Question:   Supervising Provider    Answer:   Merlyn Albert [[id number]]   Continue exercise and healthy diet. Recommend daily vitamin D and calcium.  Return in about 1 year (around 09/18/2016) for physical.

## 2015-09-23 ENCOUNTER — Telehealth: Payer: Self-pay | Admitting: *Deleted

## 2015-09-23 MED ORDER — CETIRIZINE HCL 10 MG PO TABS: 10.0000 mg | ORAL_TABLET | ORAL | 5 refills | Status: DC | PRN

## 2015-09-23 NOTE — Telephone Encounter (Signed)
Prescription sent electronically to pharmacy. 

## 2015-09-23 NOTE — Telephone Encounter (Signed)
Request refill on cetirizine 10mg  #34 one a day prn runny nose or itching. Last prescribed by Dr. Nunzio Cobbs. Pt last seen here for check up on 09/19/15

## 2015-09-23 NOTE — Telephone Encounter (Signed)
Ok six ref 

## 2015-10-22 ENCOUNTER — Encounter: Payer: Self-pay | Admitting: Family Medicine

## 2015-10-22 ENCOUNTER — Ambulatory Visit (INDEPENDENT_AMBULATORY_CARE_PROVIDER_SITE_OTHER): Payer: BLUE CROSS/BLUE SHIELD | Admitting: Family Medicine

## 2015-10-22 VITALS — BP 112/76 | Temp 98.5°F | Ht 65.0 in | Wt 206.4 lb

## 2015-10-22 DIAGNOSIS — J019 Acute sinusitis, unspecified: Secondary | ICD-10-CM

## 2015-10-22 DIAGNOSIS — H1010 Acute atopic conjunctivitis, unspecified eye: Secondary | ICD-10-CM

## 2015-10-22 DIAGNOSIS — B9689 Other specified bacterial agents as the cause of diseases classified elsewhere: Secondary | ICD-10-CM

## 2015-10-22 DIAGNOSIS — J3089 Other allergic rhinitis: Principal | ICD-10-CM

## 2015-10-22 DIAGNOSIS — Z111 Encounter for screening for respiratory tuberculosis: Secondary | ICD-10-CM | POA: Diagnosis not present

## 2015-10-22 DIAGNOSIS — J309 Allergic rhinitis, unspecified: Secondary | ICD-10-CM | POA: Diagnosis not present

## 2015-10-22 DIAGNOSIS — J302 Other seasonal allergic rhinitis: Principal | ICD-10-CM

## 2015-10-22 MED ORDER — FLUTICASONE PROPIONATE 50 MCG/ACT NA SUSP: 1.0000 | Freq: Two times a day (BID) | NASAL | 3 refills | Status: DC

## 2015-10-22 MED ORDER — AMOXICILLIN-POT CLAVULANATE 875-125 MG PO TABS: 1.0000 | ORAL_TABLET | Freq: Two times a day (BID) | ORAL | 0 refills | Status: DC

## 2015-10-22 NOTE — Progress Notes (Signed)
   Subjective:    Patient ID: Burna CashKenyana L Baumgarten, female    DOB: November 29, 1980, 35 y.o.   MRN: [id number]  Cough  This is a new problem. The current episode started 1 to 4 weeks ago. The cough is productive of sputum. Associated symptoms include ear pain, headaches and a sore throat. Treatments tried: Nyquil, Zyrtec.    Patient head congestion drainage coughing sinus pressure pain discomfort denies any other particular troubles  Review of Systems  HENT: Positive for ear pain and sore throat.   Respiratory: Positive for cough.   Neurological: Positive for headaches.       Objective:   Physical Exam  Lungs clear heart regular mild sinus tenderness eardrums normal throat normal      Assessment & Plan:  Patient was seen today for upper respiratory illness. It is felt that the patient is dealing with sinusitis. Antibiotics were prescribed today. Importance of compliance with medication was discussed. Symptoms should gradually resolve over the course of the next several days. If high fevers, progressive illness, difficulty breathing, worsening condition or failure for symptoms to improve over the next several days then the patient is to follow-up. If any emergent conditions the patient is to follow-up in the emergency department otherwise to follow-up in the office.   Patient also with a significant allergic rhinitis a recommend allergy medicine add Flonase if ongoing trouble go back to allergist

## 2015-10-25 ENCOUNTER — Telehealth: Payer: Self-pay | Admitting: Nurse Practitioner

## 2015-10-25 LAB — TB SKIN TEST
INDURATION: 0 mm
TB SKIN TEST: NEGATIVE

## 2015-10-25 NOTE — Telephone Encounter (Signed)
Patient dropped off physical form to be filled out for work. Form in office on Carolyn's Desk.

## 2015-10-25 NOTE — Telephone Encounter (Signed)
Done. At nurses desk.  

## 2015-10-29 ENCOUNTER — Telehealth: Payer: Self-pay | Admitting: *Deleted

## 2015-10-29 NOTE — Telephone Encounter (Signed)
Left message on pts voicemail that physical form is at front window ready for pickup.

## 2015-11-13 ENCOUNTER — Encounter: Payer: Self-pay | Admitting: Family Medicine

## 2015-11-13 ENCOUNTER — Ambulatory Visit (INDEPENDENT_AMBULATORY_CARE_PROVIDER_SITE_OTHER): Payer: BLUE CROSS/BLUE SHIELD | Admitting: Family Medicine

## 2015-11-13 VITALS — BP 120/78 | Temp 98.3°F | Ht 65.0 in | Wt 201.0 lb

## 2015-11-13 DIAGNOSIS — J019 Acute sinusitis, unspecified: Secondary | ICD-10-CM | POA: Diagnosis not present

## 2015-11-13 DIAGNOSIS — B9689 Other specified bacterial agents as the cause of diseases classified elsewhere: Secondary | ICD-10-CM

## 2015-11-13 DIAGNOSIS — J309 Allergic rhinitis, unspecified: Secondary | ICD-10-CM | POA: Diagnosis not present

## 2015-11-13 MED ORDER — HYDROCODONE-HOMATROPINE 5-1.5 MG/5ML PO SYRP: ORAL_SOLUTION | ORAL | 0 refills | Status: DC

## 2015-11-13 MED ORDER — PREDNISONE 20 MG PO TABS: ORAL_TABLET | ORAL | 0 refills | Status: DC

## 2015-11-13 MED ORDER — LEVOFLOXACIN 500 MG PO TABS
500.0000 mg | ORAL_TABLET | Freq: Every day | ORAL | 0 refills | Status: DC
Start: 2015-11-13 — End: 2016-03-27

## 2015-11-13 MED ORDER — MONTELUKAST SODIUM 10 MG PO TABS: 10.0000 mg | ORAL_TABLET | Freq: Every day | ORAL | 4 refills | Status: DC

## 2015-11-13 NOTE — Progress Notes (Signed)
   Subjective:    Patient ID: Gina Marquez, female    DOB: 07-14-1980, 35 y.o.   MRN: [id number]  Cough  This is a new problem. The current episode started 1 to 4 weeks ago. The problem has been unchanged. The cough is productive of sputum. Associated symptoms include nasal congestion, rhinorrhea, a sore throat and wheezing. Pertinent negatives include no chest pain, ear pain, fever or shortness of breath. Nothing aggravates the symptoms. Treatments tried: zyrtec, albuterol treatment. The treatment provided no relief.  Patient has no other concerns at this time.     Review of Systems  Constitutional: Negative for activity change and fever.  HENT: Positive for congestion, rhinorrhea and sore throat. Negative for ear pain.   Eyes: Negative for discharge.  Respiratory: Positive for cough and wheezing. Negative for shortness of breath.   Cardiovascular: Negative for chest pain.       Objective:   Physical Exam  Constitutional: She appears well-developed.  HENT:  Head: Normocephalic.  Nose: Nose normal.  Mouth/Throat: Oropharynx is clear and moist. No oropharyngeal exudate.  Neck: Neck supple.  Cardiovascular: Normal rate and normal heart sounds.   No murmur heard. Pulmonary/Chest: Effort normal. She has wheezes.  Lymphadenopathy:    She has no cervical adenopathy.  Skin: Skin is warm and dry.  Nursing note and vitals reviewed. Patient not respiratory distress some wheezing and bronchial congestion noted      Assessment & Plan:  Patient was seen today for upper respiratory illness. It is felt that the patient is dealing with sinusitis. Antibiotics were prescribed today. Importance of compliance with medication was discussed. Symptoms should gradually resolve over the course of the next several days. If high fevers, progressive illness, difficulty breathing, worsening condition or failure for symptoms to improve over the next several days then the patient is to follow-up. If any  emergent conditions the patient is to follow-up in the emergency department otherwise to follow-up in the office. Reactive airway prednisone taper Combination sinusitis with bronchitis Zithromax 5 days as directed Significant allergy issues prednisone taper should help use albuterol when necessary and Singulair continue allergy medicine see allergist next week as scheduled

## 2015-11-25 ENCOUNTER — Telehealth: Payer: Self-pay | Admitting: *Deleted

## 2015-11-25 NOTE — Telephone Encounter (Signed)
Left message for patient to call office to set up office visit.  Last OV was Jul 18, 2014.  Pt wants to start back on ITX.  Last injection was 08/13/15-Green 0.25.

## 2015-11-26 ENCOUNTER — Ambulatory Visit (INDEPENDENT_AMBULATORY_CARE_PROVIDER_SITE_OTHER): Payer: BLUE CROSS/BLUE SHIELD | Admitting: *Deleted

## 2015-11-26 ENCOUNTER — Encounter (INDEPENDENT_AMBULATORY_CARE_PROVIDER_SITE_OTHER): Payer: Self-pay

## 2015-11-26 ENCOUNTER — Encounter: Payer: Self-pay | Admitting: Allergy & Immunology

## 2015-11-26 ENCOUNTER — Ambulatory Visit (INDEPENDENT_AMBULATORY_CARE_PROVIDER_SITE_OTHER): Payer: BLUE CROSS/BLUE SHIELD | Admitting: Allergy & Immunology

## 2015-11-26 VITALS — BP 118/68 | HR 88 | Temp 98.4°F | Resp 18

## 2015-11-26 DIAGNOSIS — J309 Allergic rhinitis, unspecified: Secondary | ICD-10-CM | POA: Diagnosis not present

## 2015-11-26 DIAGNOSIS — J3089 Other allergic rhinitis: Secondary | ICD-10-CM

## 2015-11-26 NOTE — Patient Instructions (Addendum)
1. Perennial allergic rhinitis - Immunotherapy restarted today at 0.2005mL of Tyrone SchimkeGreen Vial of each of her three vials. - Cetirizine 10mg  given in clinic prior to injections. - Come back next week and we will resume the injections.  2. Return in about 6 months (around 05/26/2016).  Please inform us of any Emergency Department visits, hospitalizations, or changes in symptoms. Call us before going to the ED for breathing or allergy symptoms since we might be able to fit you in for a sick visit. Feel free to contact us anytime with any questions, problems, or concerns.  It was a pleasure to meet you today!   Websites that have reliable patient information: 1. American Academy of Asthma, Allergy, and Immunology: www.aaaai.org 2. Food Allergy Research and Education (FARE): foodallergy.org 3. Mothers of Asthmatics: http://www.asthmacommunitynetwork.org 4. American College of Allergy, Asthma, and Immunology: www.acaai.org

## 2015-11-26 NOTE — Progress Notes (Signed)
FOLLOW UP  Date of Service/Encounter:  11/26/15   Assessment:   Perennial allergic rhinitis   Plan/Recommendations:   1. Perennial allergic rhinitis - Immunotherapy restarted today at 0.69mL of Tyrone Schimke of each of her three vials. - Cetirizine 10mg  given in clinic prior to injections. - Come back next week and we will resume the injections. - Sample of Delsum provided for the coughing. - No wheezing appreciated on exam, therefore deferred albuterol treatments.  2. Return in about 6 months (around 05/26/2016).   Subjective:   Gina Marquez is a 35 y.o. female presenting today for follow up of  Chief Complaint  Patient presents with  . Urticaria    wants to start back on her injections. Last injction was in june and she was in the green vial at 0.2  .  Gina Marquez has a history of the following: Patient Active Problem List   Diagnosis Date Noted  . Prediabetes 08/03/2015  . Seasonal and perennial allergic rhinoconjunctivitis 11/15/2014  . Idiopathic urticaria 11/15/2014  . Allergic urticaria 04/11/2014  . Hyperinsulinemia 06/09/2013  . PCOS (polycystic ovarian syndrome) 06/08/2013  . Morbid obesity (HCC) 06/08/2013  . Acanthosis nigricans 06/08/2013    History obtained from: chart review and patient.  Gina Marquez was referred by Gina Punt, MD.     Gina Marquez is a 35 y.o. female presenting for a follow up visit for allergic rhinitis. Gina Marquez was last seen in our clinic in May 2016. Testing at that time indicated sensitizations to grasses, ragweed, weeds, trees, mold, dust mites, cat hair, dog hair, and cockroach. She was on allergy shots at least through June 2017, however she seems to have dropped off over the summer. She is interested in restarting injections.   Since the last visit, she has had worsening allergy symptoms over the summer. She was unable to keep up with allergy shots initially due to some problems with her mother. Her mother had some  health complications this past summer and was actually on life support. Thankfully she has improved. However, her symptoms have worsened over the summer. She wanted to come back last week for restarting her injections but she was sick. She went to see her PCP, Gina Marquez, who diagnosed her with a sinus infection. She was started on azithromycin for five days, which she has completed. One month prior to this, she had similar symptoms and was treated with Augmentin 875mg  twice daily, however she stopped taking it once she felt better. Over the last week, her symptoms have improved although she continues to have a cough. She does not have a history of asthma or other atopic conditions.  Otherwise, there have been no changes to the past medical history, surgical history, family history, or social history. She lives at home with her husband and 11yo son.     Review of Systems: a 14-point review of systems is pertinent for what is mentioned in HPI.  Otherwise, all other systems were negative. Constitutional: negative other than that listed in the HPI Eyes: negative other than that listed in the HPI Ears, nose, mouth, throat, and face: negative other than that listed in the HPI Respiratory: negative other than that listed in the HPI Cardiovascular: negative other than that listed in the HPI Gastrointestinal: negative other than that listed in the HPI Genitourinary: negative other than that listed in the HPI Integument: negative other than that listed in the HPI Hematologic: negative other than that listed in the HPI Musculoskeletal: negative  other than that listed in the HPI Neurological: negative other than that listed in the HPI Allergy/Immunologic: negative other than that listed in the HPI    Objective:   Blood pressure 118/68, pulse 88, temperature 98.4 F (36.9 C), temperature source Oral, resp. rate 18, SpO2 97 %. There is no height or weight on file to calculate BMI.   Physical Exam:     General: Alert, interactive, in no acute distress. Pleasant and talkative.  HEENT: TMs pearly gray, turbinates edematous with clear discharge, post-pharynx erythematous. Moderate oropharyngeal cobblestoning appreciated.  Neck: Supple without thyromegaly. Lungs: Clear to auscultation without wheezing, rhonchi or rales. No increased work of breathing. CV: Normal S1, S2 without murmurs. Capillary refill <2 seconds.  Abdomen: Nondistended, nontender. No guarding or rebound tenderness noted.  Skin: Warm and dry, without lesions or rashes. Extremities:  No clubbing, cyanosis or edema. Neuro:   Grossly intact. No focal deficits noted.   Diagnostic studies: None    Gina BondsJoel Gallagher, MD [facility]

## 2015-12-03 ENCOUNTER — Ambulatory Visit (INDEPENDENT_AMBULATORY_CARE_PROVIDER_SITE_OTHER): Payer: BLUE CROSS/BLUE SHIELD | Admitting: *Deleted

## 2015-12-03 DIAGNOSIS — J309 Allergic rhinitis, unspecified: Secondary | ICD-10-CM

## 2015-12-10 ENCOUNTER — Ambulatory Visit (INDEPENDENT_AMBULATORY_CARE_PROVIDER_SITE_OTHER): Payer: BLUE CROSS/BLUE SHIELD | Admitting: *Deleted

## 2015-12-10 DIAGNOSIS — J309 Allergic rhinitis, unspecified: Secondary | ICD-10-CM

## 2015-12-17 ENCOUNTER — Ambulatory Visit (INDEPENDENT_AMBULATORY_CARE_PROVIDER_SITE_OTHER): Payer: BLUE CROSS/BLUE SHIELD

## 2015-12-17 DIAGNOSIS — J309 Allergic rhinitis, unspecified: Secondary | ICD-10-CM

## 2015-12-24 ENCOUNTER — Ambulatory Visit (INDEPENDENT_AMBULATORY_CARE_PROVIDER_SITE_OTHER): Payer: BLUE CROSS/BLUE SHIELD | Admitting: *Deleted

## 2015-12-24 DIAGNOSIS — J309 Allergic rhinitis, unspecified: Secondary | ICD-10-CM | POA: Diagnosis not present

## 2015-12-31 ENCOUNTER — Ambulatory Visit (INDEPENDENT_AMBULATORY_CARE_PROVIDER_SITE_OTHER): Payer: BLUE CROSS/BLUE SHIELD | Admitting: *Deleted

## 2015-12-31 DIAGNOSIS — J309 Allergic rhinitis, unspecified: Secondary | ICD-10-CM | POA: Diagnosis not present

## 2016-01-07 ENCOUNTER — Ambulatory Visit (INDEPENDENT_AMBULATORY_CARE_PROVIDER_SITE_OTHER): Payer: BLUE CROSS/BLUE SHIELD | Admitting: *Deleted

## 2016-01-07 DIAGNOSIS — J309 Allergic rhinitis, unspecified: Secondary | ICD-10-CM

## 2016-01-14 ENCOUNTER — Ambulatory Visit (INDEPENDENT_AMBULATORY_CARE_PROVIDER_SITE_OTHER): Payer: BLUE CROSS/BLUE SHIELD | Admitting: *Deleted

## 2016-01-14 DIAGNOSIS — J309 Allergic rhinitis, unspecified: Secondary | ICD-10-CM | POA: Diagnosis not present

## 2016-01-21 ENCOUNTER — Ambulatory Visit (INDEPENDENT_AMBULATORY_CARE_PROVIDER_SITE_OTHER): Payer: BLUE CROSS/BLUE SHIELD | Admitting: *Deleted

## 2016-01-21 DIAGNOSIS — J309 Allergic rhinitis, unspecified: Secondary | ICD-10-CM | POA: Diagnosis not present

## 2016-01-28 ENCOUNTER — Ambulatory Visit (INDEPENDENT_AMBULATORY_CARE_PROVIDER_SITE_OTHER): Payer: BLUE CROSS/BLUE SHIELD | Admitting: *Deleted

## 2016-01-28 DIAGNOSIS — J309 Allergic rhinitis, unspecified: Secondary | ICD-10-CM

## 2016-02-04 ENCOUNTER — Ambulatory Visit (INDEPENDENT_AMBULATORY_CARE_PROVIDER_SITE_OTHER): Payer: BLUE CROSS/BLUE SHIELD | Admitting: *Deleted

## 2016-02-04 DIAGNOSIS — J309 Allergic rhinitis, unspecified: Secondary | ICD-10-CM

## 2016-02-11 ENCOUNTER — Ambulatory Visit (INDEPENDENT_AMBULATORY_CARE_PROVIDER_SITE_OTHER): Payer: BLUE CROSS/BLUE SHIELD | Admitting: *Deleted

## 2016-02-11 DIAGNOSIS — J309 Allergic rhinitis, unspecified: Secondary | ICD-10-CM

## 2016-03-03 ENCOUNTER — Ambulatory Visit: Payer: Self-pay | Admitting: *Deleted

## 2016-03-03 DIAGNOSIS — J309 Allergic rhinitis, unspecified: Secondary | ICD-10-CM

## 2016-03-10 ENCOUNTER — Ambulatory Visit (INDEPENDENT_AMBULATORY_CARE_PROVIDER_SITE_OTHER): Payer: BLUE CROSS/BLUE SHIELD | Admitting: *Deleted

## 2016-03-10 DIAGNOSIS — J309 Allergic rhinitis, unspecified: Secondary | ICD-10-CM

## 2016-03-24 ENCOUNTER — Ambulatory Visit (INDEPENDENT_AMBULATORY_CARE_PROVIDER_SITE_OTHER): Payer: BLUE CROSS/BLUE SHIELD | Admitting: *Deleted

## 2016-03-24 DIAGNOSIS — J309 Allergic rhinitis, unspecified: Secondary | ICD-10-CM | POA: Diagnosis not present

## 2016-03-27 ENCOUNTER — Ambulatory Visit (INDEPENDENT_AMBULATORY_CARE_PROVIDER_SITE_OTHER): Payer: BLUE CROSS/BLUE SHIELD | Admitting: Family Medicine

## 2016-03-27 ENCOUNTER — Encounter: Payer: Self-pay | Admitting: Family Medicine

## 2016-03-27 VITALS — Temp 98.7°F | Ht 65.0 in | Wt 201.2 lb

## 2016-03-27 DIAGNOSIS — R6889 Other general symptoms and signs: Secondary | ICD-10-CM

## 2016-03-27 DIAGNOSIS — B9689 Other specified bacterial agents as the cause of diseases classified elsewhere: Secondary | ICD-10-CM | POA: Diagnosis not present

## 2016-03-27 DIAGNOSIS — J019 Acute sinusitis, unspecified: Secondary | ICD-10-CM

## 2016-03-27 MED ORDER — DOXYCYCLINE HYCLATE 100 MG PO CAPS: 100.0000 mg | ORAL_CAPSULE | Freq: Two times a day (BID) | ORAL | 0 refills | Status: DC

## 2016-03-27 NOTE — Progress Notes (Signed)
Started weds Body aches chills Tried husbands prednisone Some sweats- no fever Mild headache Raw throat,sinus pain  The patient states he tried taking her husbands prednisone and is seen the make her throat feel a bit better she denies any high fever denies anything major sweats no wheezing or difficulty breathing. PMH benign Family members with similar type symptoms Does not appear toxic afebrile eardrums normal throat normal neck no masses lungs clear no crackles Assessment and plan flulike illness, viral syndrome, secondary rhinosinusitis-doxycycline twice a day for 10 days warning signs regarding progressive illness was discussed in detail patient to follow-up if progressive illness no need for x-rays or lab work currently.

## 2016-03-31 ENCOUNTER — Ambulatory Visit (INDEPENDENT_AMBULATORY_CARE_PROVIDER_SITE_OTHER): Payer: BLUE CROSS/BLUE SHIELD | Admitting: *Deleted

## 2016-03-31 DIAGNOSIS — J309 Allergic rhinitis, unspecified: Secondary | ICD-10-CM

## 2016-04-07 ENCOUNTER — Ambulatory Visit (INDEPENDENT_AMBULATORY_CARE_PROVIDER_SITE_OTHER): Payer: BLUE CROSS/BLUE SHIELD | Admitting: *Deleted

## 2016-04-07 DIAGNOSIS — J309 Allergic rhinitis, unspecified: Secondary | ICD-10-CM

## 2016-04-08 ENCOUNTER — Other Ambulatory Visit: Payer: Self-pay | Admitting: Nurse Practitioner

## 2016-04-14 ENCOUNTER — Ambulatory Visit (INDEPENDENT_AMBULATORY_CARE_PROVIDER_SITE_OTHER): Payer: BLUE CROSS/BLUE SHIELD | Admitting: *Deleted

## 2016-04-14 DIAGNOSIS — J309 Allergic rhinitis, unspecified: Secondary | ICD-10-CM

## 2016-04-21 NOTE — Addendum Note (Signed)
Addended by: Berna BueWHITAKER, CARRIE L on: 04/21/2016 04:30 PM   Modules accepted: Orders

## 2016-04-24 ENCOUNTER — Encounter: Payer: Self-pay | Admitting: Allergy

## 2016-04-24 ENCOUNTER — Ambulatory Visit (INDEPENDENT_AMBULATORY_CARE_PROVIDER_SITE_OTHER): Payer: BLUE CROSS/BLUE SHIELD | Admitting: Allergy

## 2016-04-24 VITALS — BP 108/66 | HR 78 | Temp 98.0°F | Resp 21 | Ht 64.0 in | Wt 207.0 lb

## 2016-04-24 DIAGNOSIS — T781XXD Other adverse food reactions, not elsewhere classified, subsequent encounter: Secondary | ICD-10-CM | POA: Diagnosis not present

## 2016-04-24 DIAGNOSIS — J3089 Other allergic rhinitis: Secondary | ICD-10-CM

## 2016-04-24 DIAGNOSIS — L500 Allergic urticaria: Secondary | ICD-10-CM

## 2016-04-24 MED ORDER — MONTELUKAST SODIUM 10 MG PO TABS: 10.0000 mg | ORAL_TABLET | Freq: Every day | ORAL | 5 refills | Status: DC

## 2016-04-24 MED ORDER — CETIRIZINE HCL 10 MG PO TABS: 10.0000 mg | ORAL_TABLET | ORAL | 5 refills | Status: AC | PRN

## 2016-04-24 NOTE — Progress Notes (Signed)
Follow-up Note  RE: Gina Marquez MRN: [id number] DOB: 1980-03-27 Date of Office Visit: 04/24/2016   History of present illness: Gina Marquez is a 36 y.o. female presenting today for hives and pruritus.  She was last seen in office by Dr. Dellis Anes on 11/26/15 for allergic rhinitis.  She is on AIT.  She went to get her weekly injection on Tuesday of this week but was having itching and hives and was advised to skip this week.  She presents today as she continues to have itching and hives.     She started itching on Saturday of this past week and then started developing 'welts' on arms, chest, neck and back.  Two days ago she reports the welts spread to her thighs and legs.   She has been taking benadryl as needed and doing oatmeal baths.   She takes zyrtec as needed for her allergy symptoms as well as singulair as needed.    She has had hives before.  She reports the last time this happened she was given prednisone when she first came to see [place] for evaluation in 2016.  She recalls the last time this happened she had eaten at [facility] and had hibachi steak meal with mushrooms, onions, potatoes.  She recalls eating at [facility] on this past Saturday as well and ate the same meal.   The rash started hours after eating at [facility].      She reports with fresh orange she may have lip tingle sensation.  She also stays away from blueberry and she recalls having a shake with blueberry in it the day she had the first hives back In 2016.       Review of systems: Review of Systems  Constitutional: Negative for chills, fever and malaise/fatigue.  HENT: Negative for congestion, ear pain, nosebleeds, sinus pain and sore throat.   Eyes: Negative for discharge and redness.  Respiratory: Negative for cough, shortness of breath and wheezing.   Cardiovascular: Negative for chest pain.  Gastrointestinal: Negative for abdominal pain, heartburn, nausea and vomiting.    Musculoskeletal: Negative for joint pain and myalgias.  Skin: Positive for itching and rash.  Neurological: Negative for headaches.    All other systems negative unless noted above in HPI  Past medical/social/surgical/family history have been reviewed and are unchanged unless specifically indicated below.  No changes  Medication List: Allergies as of 04/24/2016      Reactions   Codeine    Rash,itch      Medication List       Accurate as of 04/24/16 12:18 PM. Always use your most recent med list.          BENADRYL ALLERGY 25 MG tablet Generic drug:  diphenhydrAMINE Take 25 mg by mouth every 6 (six) hours as needed.   cetirizine 10 MG tablet Commonly known as:  ZYRTEC Take 1 tablet (10 mg total) by mouth as needed for allergies. Reported on 08/01/2015   fluticasone 50 MCG/ACT nasal spray Commonly known as:  FLONASE Place 1 spray into both nostrils 2 (two) times daily. Reported on 08/01/2015   montelukast 10 MG tablet Commonly known as:  SINGULAIR Take 1 tablet (10 mg total) by mouth at bedtime.   norgestimate-ethinyl estradiol 0.25-35 MG-MCG tablet Commonly known as:  ORTHO-CYCLEN,SPRINTEC,PREVIFEM Take 1 tablet by mouth daily.   sulfamethoxazole-trimethoprim 800-160 MG tablet Commonly known as:  BACTRIM DS,SEPTRA DS       Known medication allergies: Allergies  Allergen Reactions  .  Codeine     Rash,itch     Physical examination: Blood pressure 108/66, pulse 78, temperature 98 F (36.7 C), temperature source Oral, resp. rate (!) 21, height 5\' 4"  (1.626 m), weight 207 lb (93.9 kg), SpO2 99 %.  General: Alert, interactive, in no acute distress.   HEENT: TMs pearly gray, turbinates mildly edematous without discharge, post-pharynx non erythematous. Neck: Supple without lymphadenopathy. Lungs: Clear to auscultation without wheezing, rhonchi or rales. {no increased work of breathing. CV: Normal S1, S2 without murmurs. Abdomen: Nondistended,  nontender. Skin: Scattered erythematous urticarial type lesions primarily located on arms , nonvesicular. Extremities:  No clubbing, cyanosis or edema. Neuro:   Grossly intact.  Diagnositics/Labs: None today  Assessment and plan:   Urticaria, likely allergic    - concerned for possible reaction to food.   Will obtain serum IgE levels to red meats (alpha gal), mushroom, onion as well as orange and blueberry    - start back taking Zyrtec 10mg  and Singulair 10mg  daily.   If you still have issues with itch and hives increase to Zyrtec 10mg  twice a day.   Use Benadryl as needed for breakthrough symptoms.    Perennial allergic rhinitis - resume your allergy shot next Tuesday if you are hive-free - make sure to take your Zyrtec on day of your allergy shot - as above start back taking your Zyrtec, Singulair and Flonase as pollen season is approaching  Oral allergy syndrome  - her oral sensations with orange ingestion most likely represents OAS as she does have pollen allergy   - discussed symptoms usually do not progress past the mouth; she may avoid fresh fruit.   Return in about 4-6 months   Return in about 6 months (around 05/26/2016).   I appreciate the opportunity to take part in [place]'s care. Please do not hesitate to contact me with questions.  Sincerely,   Margo AyeShaylar Padgett, MD Allergy/Immunology [facility]

## 2016-04-24 NOTE — Patient Instructions (Signed)
Hives    - concerned for possible reaction to food.   Will obtain serum IgE levels to red meats (alpha gal), mushroom, onion as well as orange and blueberry    - start back taking Zyrtec 10mg  and Singulair 10mg  daily.   If you still have issues with itch and hives increase to Zyrtec 10mg  twice a day.   Use Benadryl as needed for breakthrough symptoms.    Perennial allergic rhinitis - resume your allergy shot next Tuesday if you are hive-free - make sure to take your Zyrtec on day of your allergy shot - as above start back taking your Zyrtec, Singulair and Flonase as pollen season is approaching  Return in about 4-6 months.1. Perennial allergic rhinitis - Immunotherapy restarted today at 0.8205mL of Tyrone SchimkeGreen Vial of each of her three vials. - Cetirizine 10mg  given in clinic prior to injections. - Come back next week and we will resume the injections.  2. Return in about 6 months (around 05/26/2016).  Please inform us of any Emergency Department visits, hospitalizations, or changes in symptoms. Call us before going to the ED for breathing or allergy symptoms since we might be able to fit you in for a sick visit. Feel free to contact us anytime with any questions, problems, or concerns.  It was a pleasure to meet you today!   Websites that have reliable patient information: 1. American Academy of Asthma, Allergy, and Immunology: www.aaaai.org 2. Food Allergy Research and Education (FARE): foodallergy.org 3. Mothers of Asthmatics: http://www.asthmacommunitynetwork.org 4. American College of Allergy, Asthma, and Immunology: www.acaai.org   Please inform us of any Emergency Department visits, hospitalizations, or changes in symptoms. Call us before going to the ED for breathing or allergy symptoms since we might be able to fit you in for a sick visit. Feel free to contact us anytime with any questions, problems, or concerns.  It was a pleasure to meet you today!   Websites that have reliable  patient information: 1. American Academy of Asthma, Allergy, and Immunology: www.aaaai.org 2. Food Allergy Research and Education (FARE): foodallergy.org 3. Mothers of Asthmatics: http://www.asthmacommunitynetwork.org 4. American College of Allergy, Asthma, and Immunology: www.acaai.org

## 2016-04-27 LAB — ALLERGEN, BLUEBERRY, RF288

## 2016-04-27 LAB — ALLERGEN, MUSHROOM, RF212

## 2016-04-27 LAB — ALLERGEN, ORANGE F33: Orange: <0.1 kU/L

## 2016-04-27 LAB — ALLERGEN, ONION, F48: Allergen, Onion, f48: <0.1 kU/L

## 2016-04-29 LAB — ALPHA-GAL PANEL
Beef IgE: <0.1 kU/L (ref <0.35)
CLASS: 0
CLASS: 0
Class: 0
Lamb/Mutton IgE: <0.1 kU/L (ref <0.35)

## 2016-05-12 ENCOUNTER — Ambulatory Visit (INDEPENDENT_AMBULATORY_CARE_PROVIDER_SITE_OTHER): Payer: BLUE CROSS/BLUE SHIELD | Admitting: *Deleted

## 2016-05-12 DIAGNOSIS — J309 Allergic rhinitis, unspecified: Secondary | ICD-10-CM | POA: Diagnosis not present

## 2016-05-26 ENCOUNTER — Ambulatory Visit (INDEPENDENT_AMBULATORY_CARE_PROVIDER_SITE_OTHER): Payer: BLUE CROSS/BLUE SHIELD | Admitting: *Deleted

## 2016-05-26 DIAGNOSIS — J309 Allergic rhinitis, unspecified: Secondary | ICD-10-CM

## 2016-05-28 DIAGNOSIS — J3089 Other allergic rhinitis: Secondary | ICD-10-CM | POA: Diagnosis not present

## 2016-06-09 ENCOUNTER — Ambulatory Visit (INDEPENDENT_AMBULATORY_CARE_PROVIDER_SITE_OTHER): Payer: BLUE CROSS/BLUE SHIELD | Admitting: *Deleted

## 2016-06-09 DIAGNOSIS — J309 Allergic rhinitis, unspecified: Secondary | ICD-10-CM

## 2016-06-16 ENCOUNTER — Ambulatory Visit (INDEPENDENT_AMBULATORY_CARE_PROVIDER_SITE_OTHER): Payer: BLUE CROSS/BLUE SHIELD | Admitting: *Deleted

## 2016-06-16 DIAGNOSIS — J309 Allergic rhinitis, unspecified: Secondary | ICD-10-CM

## 2016-06-23 ENCOUNTER — Ambulatory Visit (INDEPENDENT_AMBULATORY_CARE_PROVIDER_SITE_OTHER): Payer: BLUE CROSS/BLUE SHIELD | Admitting: *Deleted

## 2016-06-23 DIAGNOSIS — J309 Allergic rhinitis, unspecified: Secondary | ICD-10-CM

## 2016-06-24 ENCOUNTER — Other Ambulatory Visit: Payer: Self-pay | Admitting: Nurse Practitioner

## 2016-06-24 ENCOUNTER — Telehealth: Payer: Self-pay | Admitting: Nurse Practitioner

## 2016-06-24 MED ORDER — CYCLOBENZAPRINE HCL 10 MG PO TABS: ORAL_TABLET | ORAL | 0 refills | Status: DC

## 2016-06-24 NOTE — Telephone Encounter (Signed)
Patient made an appt with Eber Jones for 07-06-16 for back pain and weight issues. (needed late day appt.).  She wanted to see if we would call in something for her back spasms.  Said she has used muscle relaxers in the past for this.  Uses [facility] [employer]. Pt's # is [phone]

## 2016-06-24 NOTE — Telephone Encounter (Signed)
Sent something in 

## 2016-06-24 NOTE — Telephone Encounter (Signed)
Spoke with patient and informed her per Nathaneil Canary- medication was sent in for muscle spasms. Patient verbalized understanding.

## 2016-06-30 ENCOUNTER — Ambulatory Visit (INDEPENDENT_AMBULATORY_CARE_PROVIDER_SITE_OTHER): Payer: BLUE CROSS/BLUE SHIELD | Admitting: *Deleted

## 2016-06-30 DIAGNOSIS — J309 Allergic rhinitis, unspecified: Secondary | ICD-10-CM | POA: Diagnosis not present

## 2016-07-02 ENCOUNTER — Encounter: Payer: Self-pay | Admitting: Family Medicine

## 2016-07-06 ENCOUNTER — Ambulatory Visit (INDEPENDENT_AMBULATORY_CARE_PROVIDER_SITE_OTHER): Payer: BLUE CROSS/BLUE SHIELD | Admitting: Nurse Practitioner

## 2016-07-06 ENCOUNTER — Encounter: Payer: Self-pay | Admitting: Nurse Practitioner

## 2016-07-06 VITALS — BP 116/74 | Ht 64.0 in | Wt 216.0 lb

## 2016-07-06 DIAGNOSIS — E161 Other hypoglycemia: Secondary | ICD-10-CM

## 2016-07-06 DIAGNOSIS — E282 Polycystic ovarian syndrome: Secondary | ICD-10-CM

## 2016-07-06 DIAGNOSIS — R7303 Prediabetes: Secondary | ICD-10-CM

## 2016-07-06 MED ORDER — METFORMIN HCL 500 MG PO TABS: 500.0000 mg | ORAL_TABLET | Freq: Two times a day (BID) | ORAL | 2 refills | Status: DC

## 2016-07-06 MED ORDER — NORGESTIMATE-ETH ESTRADIOL 0.25-35 MG-MCG PO TABS: 1.0000 | ORAL_TABLET | Freq: Every day | ORAL | 11 refills | Status: DC

## 2016-07-06 NOTE — Progress Notes (Signed)
Subjective:  Presents for recheck on PCOS. Would like to restart oc's. Went through fertility testing; problem was with her husband. No plans for pregnancy at this point. Difficulty losing weight. Did well on Phentermine at one point but did not work as well with last Rx. Took Metformin at one point but had some mild GI issues.   Objective:   BP 116/74   Ht 5\' 4"  (1.626 m)   Wt 216 lb (98 kg)   BMI 37.08 kg/m  NAD. Alert, oriented. Lungs clear. Heart RRR. Has gained approx 15 lbs since stopping Phentermine.   Assessment:   Problem List Items Addressed This Visit      Digestive   Hyperinsulinemia     Endocrine   PCOS (polycystic ovarian syndrome) - Primary     Other   Prediabetes       Plan:   Meds ordered this encounter  Medications  . sulfamethoxazole-trimethoprim (BACTRIM DS,SEPTRA DS) 800-160 MG tablet    Refill:  0  . EPIDUO FORTE 0.3-2.5 % GEL    Refill:  98  . norgestimate-ethinyl estradiol (ORTHO-CYCLEN,SPRINTEC,PREVIFEM) 0.25-35 MG-MCG tablet    Sig: Take 1 tablet by mouth daily.    Dispense:  1 Package    Refill:  11    Order Specific Question:   Supervising Provider    Answer:   Merlyn AlbertLUKING, WILLIAM S [[id number]]  . metFORMIN (GLUCOPHAGE) 500 MG tablet    Sig: Take 1 tablet (500 mg total) by mouth 2 (two) times daily with a meal.    Dispense:  60 tablet    Refill:  2    Order Specific Question:   Supervising Provider    Answer:   Merlyn AlbertLUKING, WILLIAM S [[id number]]   Start Metformin 500 mg 1/2 tab at supper; increase over time if tolerated to 2 tabs per day.  Return in about 4 months (around 11/06/2016) for recheck PCOS. May contact office once she is on oc's if she wants to start Contrave.

## 2016-07-07 ENCOUNTER — Ambulatory Visit (INDEPENDENT_AMBULATORY_CARE_PROVIDER_SITE_OTHER): Payer: BLUE CROSS/BLUE SHIELD | Admitting: *Deleted

## 2016-07-07 DIAGNOSIS — J309 Allergic rhinitis, unspecified: Secondary | ICD-10-CM

## 2016-07-14 ENCOUNTER — Ambulatory Visit (INDEPENDENT_AMBULATORY_CARE_PROVIDER_SITE_OTHER): Payer: BLUE CROSS/BLUE SHIELD | Admitting: *Deleted

## 2016-07-14 DIAGNOSIS — J309 Allergic rhinitis, unspecified: Secondary | ICD-10-CM

## 2016-07-28 ENCOUNTER — Ambulatory Visit (INDEPENDENT_AMBULATORY_CARE_PROVIDER_SITE_OTHER): Payer: BLUE CROSS/BLUE SHIELD | Admitting: *Deleted

## 2016-07-28 DIAGNOSIS — J309 Allergic rhinitis, unspecified: Secondary | ICD-10-CM | POA: Diagnosis not present

## 2016-08-04 ENCOUNTER — Ambulatory Visit (INDEPENDENT_AMBULATORY_CARE_PROVIDER_SITE_OTHER): Payer: BLUE CROSS/BLUE SHIELD | Admitting: *Deleted

## 2016-08-04 DIAGNOSIS — J309 Allergic rhinitis, unspecified: Secondary | ICD-10-CM | POA: Diagnosis not present

## 2016-10-29 ENCOUNTER — Ambulatory Visit: Payer: BLUE CROSS/BLUE SHIELD | Admitting: Allergy

## 2016-10-29 DIAGNOSIS — J309 Allergic rhinitis, unspecified: Secondary | ICD-10-CM

## 2016-11-06 ENCOUNTER — Ambulatory Visit: Payer: BLUE CROSS/BLUE SHIELD | Admitting: Nurse Practitioner

## 2019-02-23 ENCOUNTER — Ambulatory Visit
Admission: EM | Admit: 2019-02-23 | Discharge: 2019-02-23 | Disposition: A | Discharge status: Home / Self Care | Payer: Commercial Managed Care - PPO | Attending: Emergency Medicine | Admitting: Emergency Medicine

## 2019-02-23 ENCOUNTER — Other Ambulatory Visit: Payer: Self-pay

## 2019-02-23 DIAGNOSIS — Z20828 Contact with and (suspected) exposure to other viral communicable diseases: Secondary | ICD-10-CM

## 2019-02-23 DIAGNOSIS — Z20822 Contact with and (suspected) exposure to covid-19: Secondary | ICD-10-CM

## 2019-02-23 NOTE — ED Triage Notes (Signed)
Pt needs covid test after positive exposure  

## 2019-02-23 NOTE — Discharge Instructions (Signed)

## 2019-02-23 NOTE — ED Provider Notes (Signed)
[facility]   [id number] 02/23/19 Arrival Time: 1303   CC: COVID exposure  SUBJECTIVE: History from: patient.  Gina Marquez is a 38 y.o. female who presents for COVID testing.  Husband diagnosed with COVID, last exposure x 6 days ago.  Denies recent travel.  Denies aggravating or alleviating symptoms.  Denies previous COVID infection.   Denies fever, chills, fatigue, nasal congestion, rhinorrhea, sore throat, cough, SOB, wheezing, chest pain, nausea, vomiting, changes in bowel or bladder habits.     ROS: As per HPI.  All other pertinent ROS negative.     No past medical history on file. No past surgical history on file. Allergies  Allergen Reactions  . Codeine     Rash,itch   No current facility-administered medications on file prior to encounter.   Current Outpatient Medications on File Prior to Encounter  Medication Sig Dispense Refill  . cetirizine (ZYRTEC) 10 MG tablet Take 1 tablet (10 mg total) by mouth as needed for allergies. Reported on 08/01/2015 34 tablet 5  . cyclobenzaprine (FLEXERIL) 10 MG tablet 1/2-1 tab po TID prn muscle spasms; caution due to possible drowsiness 30 tablet 0  . diphenhydrAMINE (BENADRYL ALLERGY) 25 MG tablet Take 25 mg by mouth every 6 (six) hours as needed.    Marland Kitchen EPIDUO FORTE 0.3-2.5 % GEL   98  . fluticasone (FLONASE) 50 MCG/ACT nasal spray Place 1 spray into both nostrils 2 (two) times daily. Reported on 08/01/2015 (Patient not taking: Reported on 07/06/2016) 16 g 3  . metFORMIN (GLUCOPHAGE) 500 MG tablet Take 1 tablet (500 mg total) by mouth 2 (two) times daily with a meal. 60 tablet 2  . montelukast (SINGULAIR) 10 MG tablet Take 1 tablet (10 mg total) by mouth at bedtime. 30 tablet 5  . norgestimate-ethinyl estradiol (ORTHO-CYCLEN,SPRINTEC,PREVIFEM) 0.25-35 MG-MCG tablet Take 1 tablet by mouth daily. 1 Package 11  . sulfamethoxazole-trimethoprim (BACTRIM DS,SEPTRA DS) 800-160 MG tablet   0   Social History   Socioeconomic History   . Marital status: Married    Spouse name: Not on file  . Number of children: Not on file  . Years of education: Not on file  . Highest education level: Not on file  Occupational History  . Not on file  Tobacco Use  . Smoking status: Never Smoker  . Smokeless tobacco: Never Used  Substance and Sexual Activity  . Alcohol use: No    Alcohol/week: 0.0 standard drinks  . Drug use: No  . Sexual activity: Yes    Partners: Male    Birth control/protection: None  Other Topics Concern  . Not on file  Social History Narrative  . Not on file   Social Determinants of Health   Financial Resource Strain:   . Difficulty of Paying Living Expenses: Not on file  Food Insecurity:   . Worried About Programme researcher, broadcasting/film/video in the Last Year: Not on file  . Ran Out of Food in the Last Year: Not on file  Transportation Needs:   . Lack of Transportation (Medical): Not on file  . Lack of Transportation (Non-Medical): Not on file  Physical Activity:   . Days of Exercise per Week: Not on file  . Minutes of Exercise per Session: Not on file  Stress:   . Feeling of Stress : Not on file  Social Connections:   . Frequency of Communication with Friends and Family: Not on file  . Frequency of Social Gatherings with Friends and Family: Not on file  .  Attends Religious Services: Not on file  . Active Member of Clubs or Organizations: Not on file  . Attends Archivist Meetings: Not on file  . Marital Status: Not on file  Intimate Partner Violence:   . Fear of Current or Ex-Partner: Not on file  . Emotionally Abused: Not on file  . Physically Abused: Not on file  . Sexually Abused: Not on file   Family History  Problem Relation Age of Onset  . Hypertension Mother   . Diabetes Mother   . Heart attack Mother   . Heart disease Mother 49       MI  . Heart disease Maternal Grandfather   . Diabetes Maternal Grandfather   . Diabetes Maternal Aunt   . Diabetes Maternal Aunt     OBJECTIVE:   Vitals:   02/23/19 1329  BP: (!) 141/82  Pulse: 98  Resp: 17  Temp: 99 F (37.2 C)  TempSrc: Oral  SpO2: 97%     General appearance: alert; well-appearing, nontoxic; speaking in full sentences and tolerating own secretions HEENT: NCAT; Ears: EACs clear, TMs pearly gray; Eyes: PERRL.  EOM grossly intact. Nose: nares patent without rhinorrhea, Throat: oropharynx clear, tonsils non erythematous or enlarged, uvula midline  Neck: supple without LAD Lungs: unlabored respirations, symmetrical air entry; cough: absent; no respiratory distress; CTAB Heart: regular rate and rhythm.  Skin: warm and dry Psychological: alert and cooperative; normal mood and affect  ASSESSMENT & PLAN:  1. Exposure to COVID-19 virus    COVID testing ordered.  It will take between 5-7 days for test results.  Someone will contact you regarding abnormal results.    In the meantime: You should remain isolated in your home for 10 days from symptom onset AND greater than 72 hours after symptoms resolution (absence of fever without the use of fever-reducing medication and improvement in respiratory symptoms), whichever is longer OR 14 days from exposure Get plenty of rest and push fluids Use OTC zyrtec for nasal congestion, runny nose, and/or sore throat Use OTC flonase for nasal congestion and runny nose Use medications daily for symptom relief Use OTC medications like ibuprofen or tylenol as needed fever or pain Call or go to the ED if you have any new or worsening symptoms such as fever, cough, shortness of breath, chest tightness, chest pain, turning blue, changes in mental status, etc...   Reviewed expectations re: course of current medical issues. Questions answered. Outlined signs and symptoms indicating need for more acute intervention. Patient verbalized understanding. After Visit Summary given.         Lestine Box, PA-C 02/23/19 1409

## 2019-02-24 LAB — NOVEL CORONAVIRUS, NAA: SARS-CoV-2, NAA: NOT DETECTED

## 2022-10-06 ENCOUNTER — Other Ambulatory Visit (HOSPITAL_COMMUNITY): Payer: Self-pay | Admitting: Family Medicine

## 2022-10-06 DIAGNOSIS — E782 Mixed hyperlipidemia: Secondary | ICD-10-CM

## 2022-10-14 ENCOUNTER — Other Ambulatory Visit (HOSPITAL_COMMUNITY): Payer: Self-pay | Admitting: Family Medicine

## 2022-10-14 DIAGNOSIS — Z1231 Encounter for screening mammogram for malignant neoplasm of breast: Secondary | ICD-10-CM

## 2022-10-19 ENCOUNTER — Ambulatory Visit (HOSPITAL_COMMUNITY)
Admission: RE | Admit: 2022-10-19 | Discharge: 2022-10-19 | Disposition: A | Discharge status: Home / Self Care | Payer: Commercial Managed Care - PPO | Source: Ambulatory Visit | Attending: Family Medicine | Admitting: Family Medicine

## 2022-10-19 DIAGNOSIS — Z1231 Encounter for screening mammogram for malignant neoplasm of breast: Secondary | ICD-10-CM | POA: Diagnosis present

## 2022-10-21 ENCOUNTER — Other Ambulatory Visit (HOSPITAL_COMMUNITY): Payer: Self-pay | Admitting: Family Medicine

## 2022-10-21 ENCOUNTER — Encounter (HOSPITAL_COMMUNITY): Payer: Self-pay | Admitting: Family Medicine

## 2022-10-21 DIAGNOSIS — R928 Other abnormal and inconclusive findings on diagnostic imaging of breast: Secondary | ICD-10-CM

## 2022-11-17 ENCOUNTER — Ambulatory Visit (HOSPITAL_COMMUNITY)
Admission: RE | Admit: 2022-11-17 | Discharge: 2022-11-17 | Disposition: A | Discharge status: Home / Self Care | Payer: Commercial Managed Care - PPO | Source: Ambulatory Visit | Attending: Family Medicine | Admitting: Family Medicine

## 2022-11-17 ENCOUNTER — Encounter (HOSPITAL_COMMUNITY): Payer: Self-pay

## 2022-11-17 DIAGNOSIS — R928 Other abnormal and inconclusive findings on diagnostic imaging of breast: Secondary | ICD-10-CM | POA: Diagnosis present

## 2022-11-18 ENCOUNTER — Ambulatory Visit (HOSPITAL_COMMUNITY): Admission: RE | Admit: 2022-11-18 | Payer: Commercial Managed Care - PPO | Source: Ambulatory Visit

## 2022-11-18 ENCOUNTER — Encounter (HOSPITAL_COMMUNITY): Payer: Self-pay

## 2023-06-14 ENCOUNTER — Other Ambulatory Visit (HOSPITAL_COMMUNITY): Payer: Self-pay | Admitting: Family Medicine

## 2023-06-14 ENCOUNTER — Encounter (HOSPITAL_COMMUNITY): Payer: Self-pay | Admitting: Family Medicine

## 2023-06-14 DIAGNOSIS — N63 Unspecified lump in unspecified breast: Secondary | ICD-10-CM

## 2023-07-06 ENCOUNTER — Ambulatory Visit (HOSPITAL_COMMUNITY)
Admission: RE | Admit: 2023-07-06 | Discharge: 2023-07-06 | Disposition: A | Discharge status: Home / Self Care | Source: Ambulatory Visit | Attending: Family Medicine | Admitting: Family Medicine

## 2023-07-06 ENCOUNTER — Encounter (HOSPITAL_COMMUNITY): Payer: Self-pay

## 2023-07-06 DIAGNOSIS — N63 Unspecified lump in unspecified breast: Secondary | ICD-10-CM | POA: Diagnosis present

## 2023-10-22 ENCOUNTER — Encounter: Payer: Self-pay | Admitting: Adult Health

## 2023-10-22 ENCOUNTER — Other Ambulatory Visit (HOSPITAL_COMMUNITY)
Admission: RE | Admit: 2023-10-22 | Discharge: 2023-10-22 | Disposition: A | Discharge status: Home / Self Care | Source: Ambulatory Visit | Attending: Adult Health | Admitting: Adult Health

## 2023-10-22 ENCOUNTER — Ambulatory Visit: Admitting: Adult Health

## 2023-10-22 VITALS — BP 137/89 | HR 76 | Ht 64.0 in | Wt 215.0 lb

## 2023-10-22 DIAGNOSIS — Z1331 Encounter for screening for depression: Secondary | ICD-10-CM

## 2023-10-22 DIAGNOSIS — N920 Excessive and frequent menstruation with regular cycle: Secondary | ICD-10-CM | POA: Diagnosis not present

## 2023-10-22 DIAGNOSIS — Z8742 Personal history of other diseases of the female genital tract: Secondary | ICD-10-CM | POA: Diagnosis not present

## 2023-10-22 DIAGNOSIS — Z113 Encounter for screening for infections with a predominantly sexual mode of transmission: Secondary | ICD-10-CM | POA: Insufficient documentation

## 2023-10-22 DIAGNOSIS — Z01419 Encounter for gynecological examination (general) (routine) without abnormal findings: Secondary | ICD-10-CM | POA: Insufficient documentation

## 2023-10-22 DIAGNOSIS — Z1151 Encounter for screening for human papillomavirus (HPV): Secondary | ICD-10-CM | POA: Insufficient documentation

## 2023-10-22 DIAGNOSIS — Z1211 Encounter for screening for malignant neoplasm of colon: Secondary | ICD-10-CM | POA: Diagnosis not present

## 2023-10-22 DIAGNOSIS — N852 Hypertrophy of uterus: Secondary | ICD-10-CM | POA: Diagnosis not present

## 2023-10-22 DIAGNOSIS — Z124 Encounter for screening for malignant neoplasm of cervix: Secondary | ICD-10-CM | POA: Insufficient documentation

## 2023-10-22 LAB — HEMOCCULT GUIAC POC 1CARD (OFFICE): Fecal Occult Blood, POC: NEGATIVE

## 2023-10-22 NOTE — Progress Notes (Signed)
 Patient ID: Gina Marquez, female   DOB: [birth year], 43 y.o.   MRN: [id number] History of Present Illness: Gina Marquez is a 43 year old black female, G1P1001, in for a well woman gyn exam and pap, she thinks last pap was abnormal. She is referred for heavy periods. She says she bleeds about 7 days with 3-4 days heavy, will change tampons every 2 hours, usually wears 2 and pad back up and has occasional cramps. Her iron did drop but is better and last HGB was 13.3 10/06/23 at PCP. She says she had PCOs. She wants to talk about birth control.  PCP is Carliss Batty NP    Current Medications, Allergies, Past Medical History, Past Surgical History, Family History and Social History were reviewed in [employer] record.     Review of Systems: Patient denies any headaches, hearing loss, fatigue, blurred vision, shortness of breath, chest pain, abdominal pain, problems with bowel movements, urination, or intercourse. No joint pain or mood swings.  Denies MI,stroke, DVT, breast cancer or migraine with aura. See HPI for positives.   Physical Exam:BP 137/89 (BP Location: Left Arm, Patient Position: Sitting, Cuff Size: Large)   Pulse 76   Ht 5' 4 (1.626 m)   Wt 215 lb (97.5 kg)   LMP 10/09/2023 (Exact Date)   BMI 36.90 kg/m   General:  Well developed, well nourished, no acute distress Skin:  Warm and dry Neck:  Midline trachea, normal thyroid, good ROM, no lymphadenopathy Lungs; Clear to auscultation bilaterally Breast:  No dominant palpable mass, retraction, or nipple discharge Cardiovascular: Regular rate and rhythm Abdomen:  Soft, non tender, no hepatosplenomegaly Pelvic:  External genitalia is normal in appearance, no lesions.  The vagina is normal in appearance. Urethra has no lesions or masses. The cervix is smooth, pap with HR HPV genotyping and GC/CHL and trich performed  Uterus is felt to be enlarged, about 12 week size, ?fibroid.  No adnexal masses or tenderness  noted.Bladder is non tender, no masses felt. Rectal: Good sphincter tone, no polyps, or hemorrhoids felt.  Hemoccult negative. Extremities/musculoskeletal:  No swelling or varicosities noted, no clubbing or cyanosis Psych:  No mood changes, alert and cooperative,seems happy AA is 2 Fall risk is low    10/22/2023    9:42 AM  Depression screen PHQ 2/9  Decreased Interest 1  Down, Depressed, Hopeless 0  PHQ - 2 Score 1  Altered sleeping 1  Tired, decreased energy 1  Change in appetite 1  Feeling bad or failure about yourself  0  Trouble concentrating 0  Moving slowly or fidgety/restless 0  Suicidal thoughts 0  PHQ-9 Score 4       10/22/2023    9:44 AM  GAD 7 : Generalized Anxiety Score  Nervous, Anxious, on Edge 1  Control/stop worrying 1  Worry too much - different things 1  Trouble relaxing 1  Restless 0  Easily annoyed or irritable 0  Afraid - awful might happen 0  Total GAD 7 Score 4    Upstream - 10/22/23 9062       Pregnancy Intention Screening   Does the patient want to become pregnant in the next year? No    Does the patient's partner want to become pregnant in the next year? No    Would the patient like to discuss contraceptive options today? Yes      Contraception Wrap Up   Current Method Withdrawal or Other Method    End Method Withdrawal or  Other Method    Contraception Counseling Provided Yes           Examination chaperoned by Clarita Salt LPN   Impression and plan: 1. Encounter for gynecological examination with Papanicolaou smear of cervix (Primary) Pap sent Physical in 1 year Labs with PCP Mammogram was in 10/19/22 and again 11/17/23 and 07/06/23 benign lymph nodes  - Cytology - PAP( [place])  2. Encounter for screening fecal occult blood testing Hemoccult was negative  - POCT occult blood stool  3. Menorrhagia with regular cycle heavy periods. She says she bleeds about 7 days with 3-4 days heavy, will change tampons every 2 hours,  usually wears 2 and pad back up US  scheduled for Zelda Salmon 11/01/23 at 10;30 am to assess uterus and ovaries  - US  PELVIC COMPLETE WITH TRANSVAGINAL; Future Will discuss options after results back to control periods and birth control  4. Enlarged uterus US  scheduled for Zelda Salmon 11/01/23 at 10;30 am to assess uterus and ovaries  - US  PELVIC COMPLETE WITH TRANSVAGINAL; Future  5. History of menstrual cramps

## 2023-10-26 LAB — CYTOLOGY - PAP
Chlamydia: NEGATIVE
Comment: NEGATIVE
Comment: NEGATIVE
Comment: NEGATIVE
Comment: NORMAL
Diagnosis: NEGATIVE
High risk HPV: NEGATIVE
Neisseria Gonorrhea: NEGATIVE
Trichomonas: NEGATIVE

## 2023-10-27 ENCOUNTER — Ambulatory Visit: Payer: Self-pay | Admitting: Adult Health

## 2023-11-01 ENCOUNTER — Other Ambulatory Visit: Payer: Self-pay | Admitting: Adult Health

## 2023-11-01 ENCOUNTER — Ambulatory Visit (HOSPITAL_COMMUNITY)
Admission: RE | Admit: 2023-11-01 | Discharge: 2023-11-01 | Disposition: A | Discharge status: Home / Self Care | Source: Ambulatory Visit | Attending: Adult Health | Admitting: Adult Health

## 2023-11-01 DIAGNOSIS — N852 Hypertrophy of uterus: Secondary | ICD-10-CM

## 2023-11-01 DIAGNOSIS — G5603 Carpal tunnel syndrome, bilateral upper limbs: Secondary | ICD-10-CM

## 2023-11-01 DIAGNOSIS — N920 Excessive and frequent menstruation with regular cycle: Secondary | ICD-10-CM

## 2023-11-01 DIAGNOSIS — Z8742 Personal history of other diseases of the female genital tract: Secondary | ICD-10-CM

## 2023-11-01 DIAGNOSIS — Z01419 Encounter for gynecological examination (general) (routine) without abnormal findings: Secondary | ICD-10-CM

## 2023-11-01 DIAGNOSIS — Z1211 Encounter for screening for malignant neoplasm of colon: Secondary | ICD-10-CM

## 2023-11-01 HISTORY — DX: Carpal tunnel syndrome, bilateral upper limbs: G56.03

## 2023-11-08 ENCOUNTER — Ambulatory Visit: Payer: Self-pay | Admitting: Adult Health

## 2023-11-08 DIAGNOSIS — N852 Hypertrophy of uterus: Secondary | ICD-10-CM

## 2023-11-11 ENCOUNTER — Encounter: Payer: Self-pay | Admitting: Adult Health

## 2023-11-11 ENCOUNTER — Ambulatory Visit: Admitting: Adult Health

## 2023-11-11 VITALS — BP 138/78 | HR 76 | Ht 64.0 in | Wt 215.0 lb

## 2023-11-11 DIAGNOSIS — D219 Benign neoplasm of connective and other soft tissue, unspecified: Secondary | ICD-10-CM

## 2023-11-11 DIAGNOSIS — N852 Hypertrophy of uterus: Secondary | ICD-10-CM

## 2023-11-11 DIAGNOSIS — N920 Excessive and frequent menstruation with regular cycle: Secondary | ICD-10-CM

## 2023-11-11 MED ORDER — NORETHINDRONE 0.35 MG PO TABS: 1.0000 | ORAL_TABLET | Freq: Every day | ORAL | 11 refills | Status: DC

## 2023-11-11 NOTE — Progress Notes (Signed)
  Subjective:     Patient ID: Gina Marquez, female   DOB: 1980-10-11, 43 y.o.   MRN: [id number]  HPI Gina Marquez is a 43 year old black female, married, G1P1001 in to discuss US , has heavy periods and has been anemic.     Component Value Date/Time   DIAGPAP  10/22/2023 1001    - Negative for intraepithelial lesion or malignancy (NILM)   HPVHIGH Negative 10/22/2023 1001   ADEQPAP  10/22/2023 1001    Satisfactory for evaluation; transformation zone component PRESENT.   PCP is Dr Shona   Review of Systems Periods heavy Has been anemic Denies pain or wanting more children Reviewed past medical,surgical, social and family history. Reviewed medications and allergies.     Objective:   Physical Exam BP 138/78 (BP Location: Left Arm, Patient Position: Sitting, Cuff Size: Large)   Pulse 76   Ht 5' 4 (1.626 m)   Wt 215 lb (97.5 kg)   LMP 11/05/2023 (Exact Date)   BMI 36.90 kg/m     Skin warm and dry.  Lungs: clear to ausculation bilaterally. Cardiovascular: regular rate and rhythm.  Reviewed US  with her: FINDINGS: Uterus   Measurements: 16.0 x 9.5 x 11.6 cm. = volume: 903 mL. Multiple uterine fibroids are identified. The largest of these measures up to 6 cm.   Endometrium   Thickness: 7 mm.  No focal abnormality visualized.   Right ovary   Measurements: 3.7 x 2.6 x 3.9 cm. = volume: 19.9 mL. Normal appearance/no adnexal mass.   Left ovary   Measurements: 2.9 x 1.7 x 3.4 cm. = volume: 8.7 mL. Normal appearance/no adnexal mass.   Other findings:  No abnormal free fluid.   IMPRESSION: Multiple uterine fibroids measuring up to 6 cm. This is likely the etiology of the patient's underlying vaginal bleeding. Transvaginal imaging was ordered but refused by the patient.          Result History   Upstream - 11/11/23 1611       Pregnancy Intention Screening   Does the patient want to become pregnant in the next year? No    Does the patient's partner want to become  pregnant in the next year? No    Would the patient like to discuss contraceptive options today? No      Contraception Wrap Up   Current Method Withdrawal or Other Method    End Method Oral Contraceptive;Female Condom    Contraception Counseling Provided No    How was the end contraceptive method provided? Prescription          Assessment:     1. Menorrhagia with regular cycle (Primary) Will try Micronor  for now, can start today, and use condoms for 1 pack Meds ordered this encounter  Medications   norethindrone  (MICRONOR ) 0.35 MG tablet    Sig: Take 1 tablet (0.35 mg total) by mouth daily.    Dispense:  28 tablet    Refill:  11    Supervising Provider:   JAYNE MINDER H [[id number]]     2. Enlarged uterus Uterus is 903 ml  3. Fibroids Has multiple fibroids on US     Discussed hysterectomy, myomectomy and interventional radiology  Plan:     Return 12/16/23 to talk options with Dr Ozan

## 2023-12-16 ENCOUNTER — Encounter: Payer: Self-pay | Admitting: Obstetrics & Gynecology

## 2023-12-16 ENCOUNTER — Ambulatory Visit (INDEPENDENT_AMBULATORY_CARE_PROVIDER_SITE_OTHER): Admitting: Obstetrics & Gynecology

## 2023-12-16 VITALS — BP 133/87 | HR 96 | Ht 64.0 in | Wt 223.2 lb

## 2023-12-16 DIAGNOSIS — D259 Leiomyoma of uterus, unspecified: Secondary | ICD-10-CM | POA: Diagnosis not present

## 2023-12-16 DIAGNOSIS — D219 Benign neoplasm of connective and other soft tissue, unspecified: Secondary | ICD-10-CM

## 2023-12-16 DIAGNOSIS — D500 Iron deficiency anemia secondary to blood loss (chronic): Secondary | ICD-10-CM | POA: Diagnosis not present

## 2023-12-16 DIAGNOSIS — N920 Excessive and frequent menstruation with regular cycle: Secondary | ICD-10-CM | POA: Diagnosis not present

## 2023-12-16 MED ORDER — NORETHINDRONE ACETATE 5 MG PO TABS: 5.0000 mg | ORAL_TABLET | Freq: Every day | ORAL | 4 refills | Status: AC

## 2023-12-16 NOTE — Progress Notes (Signed)
 GYN VISIT Patient name: Gina Marquez MRN [id number]  Date of birth: 11-27-1980 Chief Complaint:   Follow-up (Discuss options)  History of Present Illness:   Gina Marquez is a 43 y.o. G89P1001 female being seen today for the following concerns:  HMB: This has been an ongoing issue that has gotten worse in her 56s.  She notes that her menses typically last for 7 days or longer.  For the first few days she is often changing every 2 hours using at x 2 tampons and a pad.  Bleeding has led to iron deficiency anemia.  She presents today to discuss management options based on recent ultrasound.  She has been on the Micronor - maybe noting slight decrease in bleeding, but worsening dysmenorrhea.  Reports no other acute GYN concerns   11/01/2023: US - 16.0 x 9.5 x 11.6 cm. = volume: 903 mL. Multiple uterine fibroids are identified. The largest of these measures up to 6 cm.    Review of Systems:   Pertinent items are noted in HPI Denies fever/chills, dizziness, headaches, visual disturbances, fatigue, shortness of breath, chest pain, abdominal pain, vomiting. Pertinent History Reviewed:  No past surgical history on file.  Past Medical History:  Diagnosis Date   Anemia    Carpal tunnel syndrome, bilateral 11/01/2023   Fibroids    PCOS (polycystic ovarian syndrome)    Reviewed problem list, medications and allergies. Physical Assessment:   Vitals:   12/16/23 1437  BP: 133/87  Pulse: 96  Weight: 223 lb 3.2 oz (101.2 kg)  Height: 5' 4 (1.626 m)  Body mass index is 38.31 kg/m.       Physical Examination:   General appearance: alert, well appearing, and in no distress  Psych: mood appropriate, normal affect  Skin: warm & dry   Cardiovascular: normal heart rate noted  Respiratory: normal respiratory effort, no distress  Abdomen: soft, non-tender, enlarged irregular shaped uterus below umbilicus  Pelvic: examination not indicated  Extremities: no edema   Chaperone: N/A     Assessment & Plan:  1) HMB, uterine fibroids, iron deficiency anemia - Long discussion regarding management options - Reviewed increasing current pill, Myfembree, IUD - Discussed uterine artery embolization - Reviewed risks, benefits and potential complications and side effects of each option.  Discussed that should the [place] be desired recommendation for consultation with IR to go through procedure in more detail. --Discussed robotic- assisted laparoscopic hysterectomy and bilateral salpingectomy  -Explained that this surgery is performed to remove the uterus through several small incisions in the abdomen- pending anatomy 3-5 ports. I discussed the risks and benefits of the surgery, including, but not limited to risk of bleeding, including the need for blood transfusion, infection, damage to surrounding organs and tissues such as damage to bladder, ureter or bowel that would requiring additional procedures.  Reviewed long term complications such as fistula or dehiscence requiring further surgical intervention.  Discussed possible need for conversion to an open procedure and potential for other complications that cannot be predicted or prevented including DVT, PE or death. -reviewed same day procedure -typical recovery 8-12 wks with several weeks off work and 8wks of pelvic rest  -Appropriate questions and concerns and patient wanted more time to review her options.  She might consider surgery in January - For now plan to change to Aygestin  - Follow-up in 2 to 3 months  Meds ordered this encounter  Medications   norethindrone  (AYGESTIN ) 5 MG tablet    Sig: Take 1 tablet (5 mg  total) by mouth daily.    Dispense:  90 tablet    Refill:  4    Majority of today's visit was spent in consultation greater than 30 minutes  Return in about 2 months (around 02/15/2024) for Medication follow up.   Niobe Dick, DO Attending Obstetrician & Gynecologist, [facility] for [employer], [facility]

## 2024-02-23 ENCOUNTER — Ambulatory Visit (INDEPENDENT_AMBULATORY_CARE_PROVIDER_SITE_OTHER): Admitting: Obstetrics & Gynecology

## 2024-02-23 ENCOUNTER — Encounter: Payer: Self-pay | Admitting: Obstetrics & Gynecology

## 2024-02-23 VITALS — BP 134/85 | HR 75 | Ht 64.0 in | Wt 219.6 lb

## 2024-02-23 DIAGNOSIS — N920 Excessive and frequent menstruation with regular cycle: Secondary | ICD-10-CM

## 2024-02-23 DIAGNOSIS — D219 Benign neoplasm of connective and other soft tissue, unspecified: Secondary | ICD-10-CM

## 2024-02-23 DIAGNOSIS — D259 Leiomyoma of uterus, unspecified: Secondary | ICD-10-CM

## 2024-02-23 NOTE — Progress Notes (Signed)
" ° °  GYN VISIT Patient name: Gina Marquez MRN [id number]  Date of birth: 12-07-1980 Chief Complaint:   Follow-up  History of Present Illness:   Gina Marquez is a 43 y.o. G70P1001  female being seen today for follow up regarding:  HMB/Uterine fibroids: At her last visit management options were reviewed and patient was trialed on Aygestin  as she strongly desired to avoid surgery.  Patient notes that the medication has been life-changing. Currently menses-this is her second menses since starting and so far it has been very light.  In November - menses started 27th (2wks late from her regular menses around the 11th) she had spotting for a few days then 2-3 days moderate bleeding- using a regular tampon and changing less frequently, no large clots.  Lasted for about 5 days.  Overall she is very pleased with the change in bleeding.  She denies significant dysmenorrhea.  She is feeling much better and has stopped the iron as this also was significantly causing constipation and GI upset.  No LMP recorded.    Review of Systems:   Pertinent items are noted in HPI Denies fever/chills, dizziness, headaches, visual disturbances, fatigue, shortness of breath, chest pain, abdominal pain, vomiting. Pertinent History Reviewed:  No past surgical history on file.  Past Medical History:  Diagnosis Date   Anemia    Carpal tunnel syndrome, bilateral 11/01/2023   Fibroids    PCOS (polycystic ovarian syndrome)    Reviewed problem list, medications and allergies. Physical Assessment:   Vitals:   02/23/24 1544  BP: 134/85  Pulse: 75  Weight: 219 lb 9.6 oz (99.6 kg)  Height: 5' 4 (1.626 m)  Body mass index is 37.69 kg/m.       Physical Examination:   General appearance: alert, well appearing, and in no distress  Psych: mood appropriate, normal affect  Skin: warm & dry   Cardiovascular: normal heart rate noted  Respiratory: normal respiratory effort, no distress  Extremities: no edema    Chaperone: N/A    Assessment & Plan:  1) HMB, Uterine fibroids - Doing very well with current medication and plan to continue - No further changes at this time - Will plan to recheck CBC either at next annual or with PCP  No orders of the defined types were placed in this encounter.   Return in about 1 year (around 02/22/2025) for Oct 2026.   Yoshiaki Kreuser, DO Attending Obstetrician & Gynecologist, [facility] for [facility], [facility]    "

## 2024-03-16 ENCOUNTER — Encounter: Payer: Self-pay | Admitting: Obstetrics & Gynecology
